# Patient Record
Sex: Female | Born: 1970
Health system: Southern US, Community
[De-identification: ages and names within clinical notes are randomized; demographics above are authoritative.]

## PROBLEM LIST (undated history)

## (undated) DIAGNOSIS — I471 Supraventricular tachycardia, unspecified: Secondary | ICD-10-CM

## (undated) DIAGNOSIS — I10 Essential (primary) hypertension: Secondary | ICD-10-CM

## (undated) DIAGNOSIS — K529 Noninfective gastroenteritis and colitis, unspecified: Secondary | ICD-10-CM

## (undated) DIAGNOSIS — N809 Endometriosis, unspecified: Secondary | ICD-10-CM

## (undated) DIAGNOSIS — K589 Irritable bowel syndrome without diarrhea: Secondary | ICD-10-CM

## (undated) HISTORY — PX: LAPAROSCOPY: SHX197

## (undated) HISTORY — PX: COLONOSCOPY: SHX174

---

## 2001-02-19 ENCOUNTER — Emergency Department (HOSPITAL_COMMUNITY): Admission: EM | Admit: 2001-02-19 | Discharge: 2001-02-19 | Payer: Self-pay

## 2001-03-17 ENCOUNTER — Emergency Department (HOSPITAL_COMMUNITY): Admission: EM | Admit: 2001-03-17 | Discharge: 2001-03-17 | Payer: Self-pay | Admitting: Emergency Medicine

## 2002-01-02 ENCOUNTER — Emergency Department (HOSPITAL_COMMUNITY): Admission: EM | Admit: 2002-01-02 | Discharge: 2002-01-03 | Payer: Self-pay | Admitting: Emergency Medicine

## 2003-05-01 ENCOUNTER — Emergency Department (HOSPITAL_COMMUNITY): Admission: EM | Admit: 2003-05-01 | Discharge: 2003-05-01 | Payer: Self-pay | Admitting: Emergency Medicine

## 2003-07-22 ENCOUNTER — Emergency Department (HOSPITAL_COMMUNITY): Admission: EM | Admit: 2003-07-22 | Discharge: 2003-07-23 | Payer: Self-pay | Admitting: Emergency Medicine

## 2005-03-23 ENCOUNTER — Emergency Department (HOSPITAL_COMMUNITY): Admission: EM | Admit: 2005-03-23 | Discharge: 2005-03-23 | Payer: Self-pay | Admitting: Emergency Medicine

## 2006-11-02 ENCOUNTER — Inpatient Hospital Stay (HOSPITAL_COMMUNITY): Admission: EM | Admit: 2006-11-02 | Discharge: 2006-11-06 | Payer: Self-pay | Admitting: Emergency Medicine

## 2007-01-18 ENCOUNTER — Other Ambulatory Visit: Payer: Self-pay

## 2007-01-18 ENCOUNTER — Other Ambulatory Visit: Payer: Self-pay | Admitting: Emergency Medicine

## 2007-01-18 ENCOUNTER — Observation Stay (HOSPITAL_COMMUNITY): Admission: AD | Admit: 2007-01-18 | Discharge: 2007-01-19 | Payer: Self-pay | Admitting: Cardiovascular Disease

## 2007-01-26 ENCOUNTER — Encounter: Admission: RE | Admit: 2007-01-26 | Discharge: 2007-01-26 | Payer: Self-pay | Admitting: Cardiovascular Disease

## 2007-12-23 ENCOUNTER — Inpatient Hospital Stay (HOSPITAL_COMMUNITY): Admission: AD | Admit: 2007-12-23 | Discharge: 2007-12-23 | Payer: Self-pay | Admitting: Obstetrics & Gynecology

## 2007-12-26 ENCOUNTER — Emergency Department (HOSPITAL_COMMUNITY): Admission: EM | Admit: 2007-12-26 | Discharge: 2007-12-27 | Payer: Self-pay | Admitting: Emergency Medicine

## 2009-01-23 ENCOUNTER — Emergency Department (HOSPITAL_COMMUNITY): Admission: EM | Admit: 2009-01-23 | Discharge: 2009-01-23 | Payer: Self-pay | Admitting: Emergency Medicine

## 2010-03-27 ENCOUNTER — Ambulatory Visit: Payer: Self-pay | Admitting: Internal Medicine

## 2010-04-13 ENCOUNTER — Emergency Department (HOSPITAL_COMMUNITY): Admission: EM | Admit: 2010-04-13 | Discharge: 2010-04-13 | Payer: Self-pay | Admitting: Emergency Medicine

## 2010-08-21 LAB — WET PREP, GENITAL
Clue Cells Wet Prep HPF POC: NONE SEEN
Trich, Wet Prep: NONE SEEN
WBC, Wet Prep HPF POC: NONE SEEN
Yeast Wet Prep HPF POC: NONE SEEN

## 2010-08-21 LAB — GC/CHLAMYDIA PROBE AMP, GENITAL
Chlamydia, DNA Probe: NEGATIVE
GC Probe Amp, Genital: NEGATIVE

## 2010-09-10 ENCOUNTER — Emergency Department (HOSPITAL_COMMUNITY)
Admission: EM | Admit: 2010-09-10 | Discharge: 2010-09-11 | Discharge status: Against Medical Advice | Payer: Medicaid Other | Attending: Emergency Medicine | Admitting: Emergency Medicine

## 2010-09-10 DIAGNOSIS — W57XXXA Bitten or stung by nonvenomous insect and other nonvenomous arthropods, initial encounter: Secondary | ICD-10-CM | POA: Insufficient documentation

## 2010-09-10 DIAGNOSIS — S30860A Insect bite (nonvenomous) of lower back and pelvis, initial encounter: Secondary | ICD-10-CM | POA: Insufficient documentation

## 2010-09-17 ENCOUNTER — Ambulatory Visit (INDEPENDENT_AMBULATORY_CARE_PROVIDER_SITE_OTHER): Payer: Self-pay | Admitting: Internal Medicine

## 2010-09-18 ENCOUNTER — Ambulatory Visit (INDEPENDENT_AMBULATORY_CARE_PROVIDER_SITE_OTHER): Payer: Medicaid Other | Admitting: Internal Medicine

## 2010-09-18 DIAGNOSIS — R634 Abnormal weight loss: Secondary | ICD-10-CM

## 2010-09-18 DIAGNOSIS — K589 Irritable bowel syndrome without diarrhea: Secondary | ICD-10-CM

## 2010-10-23 NOTE — Discharge Summary (Signed)
Becky Vargas, Becky Vargas              ACCOUNT NO.:  [id number]   MEDICAL RECORD NO.:  [id number]          PATIENT TYPE:  INP   LOCATION:  2012                         FACILITY:  [facility]   PHYSICIAN:  Ricki Rodriguez, M.D.  DATE OF BIRTH:  11-23-1970   DATE OF ADMISSION:  11/02/2006  DATE OF DISCHARGE:  11/06/2006                               DISCHARGE SUMMARY   FINAL DIAGNOSES:  1. Supraventricular tachy arrhythmia.  2. Noncardiac chest pain.  3. Paroxysmal atrial flutter.  4. Nonsustained ventricular tachycardia.  5. Anxiety.  6. Tobacco use disorder.  7. Marijuana abuse.   DISCHARGE MEDICATIONS:  1. Aspirin 325 mg 1 daily.  2. Lipitor 80 mg 1 daily.  3. Lopressor 50 mg 1 twice daily.  4. Norethindrone 5 mg daily.  5. Diltiazem 240 mg CD/ER 1 daily.   WOUND CARE:  Patient will notify right groin pain, swelling or  discharge.   DISCHARGE DIET:  Heart healthy diet.   DISCHARGE ACTIVITIES:  Patient increase activity slowly.   FOLLOWUP:  By Dr. Orpah Cobb in 2 weeks.  Patient to call [phone] for  appointment and by Dr. Reuel Boom in 2-4 weeks.  Patient to call [phone]  for appointment.   SPECIAL INSTRUCTIONS:  The patient to continue smoking, alcohol, drugs  and caffeine use.   HISTORY:  This is a 40 year old black female presented with  supraventricular tachycardia and chest pain.  Patient had a history of  drug abuse.   PHYSICAL EXAMINATION:  VITAL SIGNS:  Heart rate 109 after medication,  prior to that it was 180.  Blood pressure 101/67.  Respirations are 18.  GENERAL:  Patient is well-nourished, short woman in no acute distress.  HEENT:  Unremarkable.  NECK:  No JVD.  No carotid bruits.  LUNGS:  Clear bilaterally.  HEART:  Normal S1, S2 without S3 or gallop.  ABDOMEN:  Soft and nontender.  EXTREMITIES:  No edema.  NEUROLOGICAL:  Patient had normal cranial nerves and moves all 4  extremities.   LABORATORY DATA:  Normal electrolytes, BUN and creatinine.  Normal  WBC  count, hemoglobin, hematocrit and platelet count.  Cardiac enzymes:  CK-  MB elevated at 22 and troponin of 1.67.  Lipid profile near normal with  a low HDL of 33.   Cardiac catheterization showed normal coronaries.   HOSPITAL COURSE:  Patient was admitted to telemetry unit because of  abnormal cardiac enzymes and chest pain.  He underwent cardiac  catheterization, but failed to show any significant coronary artery  disease.  As it appears, the patient may have coronary spasm secondary  to drug abuse.  She was strongly advised to discontinue any cardiac  stimulant medication or food and she was treated well with IV Cardizem,  followed by p.o. Cardizem, beta-blockers were added and aspirin and  Lipitor were added also.  She may have addition of niacin in the near  future if her low HDL level does not improve with exercise.  She was  strongly advised to change her current lifestyle and she will be  followed by me in 2 weeks and by primary  care physician, Dr. Reuel Boom, in  2-4 weeks.      Ricki Rodriguez, M.D.  Electronically Signed     ASK/MEDQ  D:  11/06/2006  T:  11/06/2006  Job:  [id number]   cc:   Donzetta Sprung

## 2010-10-23 NOTE — Cardiovascular Report (Signed)
Becky Vargas, Becky Vargas              ACCOUNT NO.:  [id number]   MEDICAL RECORD NO.:  [id number]          PATIENT TYPE:  INP   LOCATION:  2012                         FACILITY:  [facility]   PHYSICIAN:  Ricki Rodriguez, M.D.  DATE OF BIRTH:  Aug 01, 1970   DATE OF PROCEDURE:  11/04/2006  DATE OF DISCHARGE:                            CARDIAC CATHETERIZATION   PROCEDURE PERFORMED:  Left heart catheterization and selective coronary  angiography.   APPROACH:  Right femoral artery using 5-French sheath and catheters.   COMPLICATIONS:  None.   INDICATIONS:  This 40 year old black female with recurrent chest pain  had abnormal cardiac enzymes and EKG changes of ischemia along with  history of drug abuse and smoking.   HEMODYNAMIC DATA:  The left ventricular pressure was 131/14 and aortic  pressure was 133/72.   CORONARY ANATOMY:  LEFT MAIN CORONARY ARTERY:  The left main coronary  artery was unremarkable.   LEFT ANTERIOR DESCENDING CORONARY ARTERY:  The left anterior descending  coronary artery was also unremarkable and it wrapped around the apex of  the heart supplying a posterior 1/3 to 1/2 of the septum.  The diagonal  vessel one and two were unremarkable.   LEFT CIRCUMFLEX CORONARY ARTERY:  The left circumflex coronary artery  was also unremarkable.  It was dominant.  Its obtuse marginal branch was  normal and posterolateral and posterior descending coronary arteries  were also unremarkable.   RIGHT CORONARY ARTERY:  The right artery was very small and  unremarkable.   IMPRESSION:  1. Normal coronaries.  2. Noncardiac chest pain.   RECOMMENDATIONS:  This patient will be treated medically with lifestyle  modification.      Ricki Rodriguez, M.D.  Electronically Signed     ASK/MEDQ  D:  11/04/2006  T:  11/04/2006  Job:  [id number]

## 2010-10-26 NOTE — Discharge Summary (Signed)
NAMEALVIA, Becky Vargas              ACCOUNT NO.:  [id number]   MEDICAL RECORD NO.:  [id number]          PATIENT TYPE:  INP   LOCATION:  2012                         FACILITY:  [facility]   PHYSICIAN:  Osvaldo Shipper. Spruill, M.D.DATE OF BIRTH:  Feb 16, 1971   DATE OF ADMISSION:  11/02/2006  DATE OF DISCHARGE:  11/06/2006                               DISCHARGE SUMMARY   DISCHARGE DIAGNOSIS:  Chest pain.   PROCEDURE:  Cardiac catheterization on Nov 04, 2006 by Dr. Orpah Cobb.   Becky Vargas is a 40 year old patient who presented to the [facility]  emergency department with complaint of chest pain.  The patient gave a  history that the pain started on the day prior to this admission.  It  did not stop yesterday and kept returning throughout the night.  It was  associated with shortness of breath and nausea but no vomiting.  There  was also noted of fast heart rate.  The patient was evaluated in the  emergency department.  The heart rate noted in the emergency department  was 189 with a blood pressure of 82 by Doppler.   The patient was given adenosine, and this was followed by IV Cardizem  and then an IV Cardizem drip.  The patient's heart rate began to  improve.  It was the opinion that the patient would need an admission,  and an admission history and physical was completed by Dr. Donia Guiles.  On further questioning, the patient denied any cocaine use or  IV drug use of any kind.  He denied any use of over-the-counter herbs or  other medications.  There was no childhood history of heart disease, and  there was no history of thyroid disease.  No history of hypertension or  diabetes.   The patient was subsequently admitted, was continued on a Cardizem drip,  was placed on telemetry.  Thyroid workup was initiated, as well as a  workup to rule out a pulmonary embolus, as there was documented atrial  fibrillation during the patient's evaluation in the emergency  department.  Plans were made  for cardiac catheterization.   The cardiac catheterization was done on Nov 04, 2006.  It revealed the  left main to be okay, the LAD to be okay, the circumflex to be okay with  a dominant OM.  The right coronary artery was small, but overall there  were normal coronary arteries.  Following this, a transthoracic  echocardiogram was performed which revealed the overall left ventricular  systolic function to be normal.  The left ventricular ejection fraction  was estimated to be 60%.  There were no left ventricular regional wall  motion abnormalities noted.   On Nov 05, 2006, the patient was feeling better.  The vital signs were  stable with the exception of a sinus bradycardia, and on Nov 06, 2006,  it was the opinion that the patient had received maximum benefit from  this hospitalization and was in stable condition and could be discharged  home.   MEDICATIONS AT DISCHARGE:  1. Aspirin daily.  2. Niacin 250 mg at bedtime for 2  weeks and then 500 mg at bedtime.  3. Lopressor 50 mg twice a day.  4. Diltiazem 240 mg CD ER one daily.  5. Norethindrone 5 mg daily.   FOLLOWUP:  The patient is to see Dr. Algie Coffer in 2 weeks for follow up.  The patient is to notify the physician immediately if any changes,  problems or concerns.      Ivery Quale, P.A.      Osvaldo Shipper. Spruill, M.D.  Electronically Signed    HB/MEDQ  D:  12/09/2006  T:  12/09/2006  Job:  [id number]

## 2010-10-26 NOTE — Discharge Summary (Signed)
Vargas, Becky              ACCOUNT NO.:  [id number]   MEDICAL RECORD NO.:  [id number]          PATIENT TYPE:  OBV   LOCATION:  3729                         FACILITY:  [facility]   PHYSICIAN:  Ricki Rodriguez, M.D.  DATE OF BIRTH:  [birth year]   DATE OF ADMISSION:  01/18/2007  DATE OF DISCHARGE:  01/19/2007                               DISCHARGE SUMMARY   FINAL DIAGNOSES:  1. Supraventricular tachycardia  2. Anxiety  3. Tobacco use disorder   DISCHARGE ACTIVITIES:  The patient to increase activity slowly.   DISCHARGE DIET:  Low-sodium heart-healthy no caffeine diet.   CONDITION ON DISCHARGE:  Improved.   FOLLOW-UP:  By Dr. Orpah Cobb in 1 week.  The patient to call [phone]  for appointment   DISCHARGE MEDICATIONS:  1. Metoprolol 50 mg half twice daily  2. Diltiazem extended release 300 mg daily  3. Aspirin 325 mg one at bedtime  4. Niacin 500 mg one at bedtime.   HISTORY:  This 40 year old black female with a known history of  paroxysmal supraventricular  tachycardia generally controlled with  Cardizem had palpitations on visiting church.  No chest pain, dizziness,  shortness of breath.  Thyroid-stimulating hormone was normal 3 months  ago.  The patient has cardiac risk factors of smoking and she has  occasional alcohol intake and occasional recreational drug use.   PHYSICAL EXAMINATION:  Pulse 65, respiration 16, blood pressure 102/78,  temperature 98.8.  She is approximately 5 feet 1 inch and weighs  approximately 140 pounds.  HEENT: The patient is normocephalic, atraumatic with round eyes.  Conjunctivae pink.  Sclerae are white.  Extraocular movement intact.  NECK:  No JVD, no carotid bruit.  LUNGS:  Clear bilaterally.  HEART:  Normal S1-S2.  ABDOMEN:  Soft and nontender.  EXTREMITIES:  No edema, cyanosis, clubbing.  CNS: Cranial nerves grossly intact.  Marland Kitchen   LABORATORY DATA:  Normal hemoglobin/hematocrit, WBC count, platelet  count, normal electrolytes, BUN,  creatinine.  EKG showed sinus  bradycardia with possible septal infarct.   HOSPITAL COURSE:  The patient was admitted to telemetry unit. There were  no episodes of supraventricular tachyarrhythmias.  A small dose of beta  blocker was added to further control the patient's symptoms.  He was  given a choice of considering ablation with  EP consult if medications  do not work. Apparently the patient wants to continue medications for  now.      Ricki Rodriguez, M.D.  Electronically Signed     ASK/MEDQ  D:  04/16/2007  T:  04/16/2007  Job:  [id number]

## 2010-11-06 ENCOUNTER — Ambulatory Visit (INDEPENDENT_AMBULATORY_CARE_PROVIDER_SITE_OTHER): Payer: Medicaid Other | Admitting: Internal Medicine

## 2010-11-23 ENCOUNTER — Emergency Department (HOSPITAL_COMMUNITY)
Admission: EM | Admit: 2010-11-23 | Discharge: 2010-11-23 | Disposition: A | Discharge status: Home / Self Care | Payer: Medicaid Other | Attending: Emergency Medicine | Admitting: Emergency Medicine

## 2010-11-23 DIAGNOSIS — M79609 Pain in unspecified limb: Secondary | ICD-10-CM | POA: Insufficient documentation

## 2010-11-23 DIAGNOSIS — K589 Irritable bowel syndrome without diarrhea: Secondary | ICD-10-CM | POA: Insufficient documentation

## 2010-11-23 DIAGNOSIS — M25519 Pain in unspecified shoulder: Secondary | ICD-10-CM | POA: Insufficient documentation

## 2010-11-23 DIAGNOSIS — I10 Essential (primary) hypertension: Secondary | ICD-10-CM | POA: Insufficient documentation

## 2010-11-23 DIAGNOSIS — M542 Cervicalgia: Secondary | ICD-10-CM | POA: Insufficient documentation

## 2010-11-23 DIAGNOSIS — I498 Other specified cardiac arrhythmias: Secondary | ICD-10-CM | POA: Insufficient documentation

## 2010-11-23 DIAGNOSIS — F17200 Nicotine dependence, unspecified, uncomplicated: Secondary | ICD-10-CM | POA: Insufficient documentation

## 2010-12-28 ENCOUNTER — Emergency Department (HOSPITAL_COMMUNITY)
Admission: EM | Admit: 2010-12-28 | Discharge: 2010-12-28 | Disposition: A | Discharge status: Other Acute Inpatient Hospital | Payer: Medicaid Other | Attending: Emergency Medicine | Admitting: Emergency Medicine

## 2010-12-28 DIAGNOSIS — F17200 Nicotine dependence, unspecified, uncomplicated: Secondary | ICD-10-CM | POA: Insufficient documentation

## 2010-12-28 DIAGNOSIS — K648 Other hemorrhoids: Secondary | ICD-10-CM

## 2010-12-28 DIAGNOSIS — B9689 Other specified bacterial agents as the cause of diseases classified elsewhere: Secondary | ICD-10-CM | POA: Insufficient documentation

## 2010-12-28 DIAGNOSIS — N760 Acute vaginitis: Secondary | ICD-10-CM | POA: Insufficient documentation

## 2010-12-28 DIAGNOSIS — I10 Essential (primary) hypertension: Secondary | ICD-10-CM | POA: Insufficient documentation

## 2010-12-28 DIAGNOSIS — A499 Bacterial infection, unspecified: Secondary | ICD-10-CM | POA: Insufficient documentation

## 2010-12-28 HISTORY — DX: Supraventricular tachycardia: I47.1

## 2010-12-28 HISTORY — DX: Supraventricular tachycardia, unspecified: I47.10

## 2010-12-28 HISTORY — DX: Essential (primary) hypertension: I10

## 2010-12-28 LAB — URINALYSIS, ROUTINE W REFLEX MICROSCOPIC
Glucose, UA: NEGATIVE mg/dL
Ketones, ur: 15 mg/dL — AB
Leukocytes, UA: NEGATIVE
Nitrite: NEGATIVE
Specific Gravity, Urine: 1.025 (ref 1.005–1.030)
Urobilinogen, UA: 1 mg/dL (ref 0.0–1.0)
pH: 6.5 (ref 5.0–8.0)

## 2010-12-28 LAB — WET PREP, GENITAL
Trich, Wet Prep: NONE SEEN
Yeast Wet Prep HPF POC: NONE SEEN

## 2010-12-28 LAB — URINE MICROSCOPIC-ADD ON

## 2010-12-28 LAB — PREGNANCY, URINE: Preg Test, Ur: NEGATIVE

## 2010-12-28 MED ORDER — HYDROCORTISONE 2.5 % RE CREA: TOPICAL_CREAM | RECTAL | Status: AC

## 2010-12-28 MED ORDER — METRONIDAZOLE 500 MG PO TABS: 500.0000 mg | ORAL_TABLET | Freq: Two times a day (BID) | ORAL | Status: AC

## 2010-12-28 NOTE — ED Provider Notes (Signed)
History     Chief Complaint  Patient presents with  . Anal Itching  . Vaginal Pain   Patient is a 40 y.o. female presenting with vaginal pain. The history is provided by the patient.  Vaginal Pain This is a new (She describes pain with urination and constant pain and burning irritation of her vagina.  She was treated for  trichomonas 4 months ago by her gyn in [place],  symptoms were better until this past week.) problem. The current episode started in the past 7 days. The problem occurs constantly. Pertinent negatives include no abdominal pain, anorexia, arthralgias, chest pain, congestion, fever, headaches, joint swelling, nausea, neck pain, numbness, rash, sore throat, swollen glands or weakness. Associated symptoms comments: She has had clear to white vaginal dc.  Last menses more than one year ago,  Off depo since 1/12.  Also reports anal irritation and itching,  Had dark but well formed stool this am.   States she had a "piece of tissue" sticking out after a bm today,  But she pushed it back inside and pain improved.   Sexually active with 2 partners since tx for trich 3/12.  Reports always uses condom.. Exacerbated by: urination. She has tried nothing for the symptoms.    Past Medical History  Diagnosis Date  . SVT (supraventricular tachycardia)   . Hypertension     History reviewed. No pertinent past surgical history.  History reviewed. No pertinent family history.  History  Substance Use Topics  . Smoking status: Current Everyday Smoker -- 1.0 packs/day  . Smokeless tobacco: Not on file  . Alcohol Use: Yes    OB History    Grav Para Term Preterm Abortions TAB SAB Ect Mult Living                  Review of Systems  Constitutional: Negative for fever.  HENT: Negative for congestion, sore throat and neck pain.   Eyes: Negative.   Respiratory: Negative for chest tightness and shortness of breath.   Cardiovascular: Negative for chest pain.  Gastrointestinal: Negative for  nausea, abdominal pain and anorexia.  Genitourinary: Positive for dysuria, vaginal discharge and vaginal pain. Negative for urgency, decreased urine volume and vaginal bleeding.  Musculoskeletal: Negative for joint swelling and arthralgias.  Skin: Negative.  Negative for rash and wound.  Neurological: Negative for dizziness, weakness, light-headedness, numbness and headaches.  Hematological: Negative.   Psychiatric/Behavioral: Negative.     Physical Exam  BP 124/82  Pulse 93  Temp(Src) 99 F (37.2 C) (Oral)  Resp 20  Ht 4\' 11"  (1.499 m)  Wt 110 lb (49.896 kg)  BMI 22.22 kg/m2  SpO2 99%  Physical Exam  Vitals reviewed. Constitutional: She is oriented to person, place, and time. She appears well-developed and well-nourished.  HENT:  Head: Normocephalic and atraumatic.  Eyes: Conjunctivae are normal.  Neck: Normal range of motion.  Cardiovascular: Normal rate, regular rhythm, normal heart sounds and intact distal pulses.   Pulmonary/Chest: Effort normal and breath sounds normal. She has no wheezes.  Abdominal: Soft. Bowel sounds are normal. There is no tenderness.  Genitourinary: Rectum normal, vagina normal and uterus normal. Rectal exam shows no tenderness. Guaiac negative stool. Pelvic exam was performed with patient supine. Cervix exhibits no motion tenderness and no friability. Right adnexum displays no mass and no tenderness. Left adnexum displays no mass and no tenderness. No erythema or tenderness around the vagina. No vaginal discharge found.       No appreciable internal  hemorrhoid palpated.  Musculoskeletal: Normal range of motion.  Neurological: She is alert and oriented to person, place, and time.  Skin: Skin is warm and dry.  Psychiatric: She has a normal mood and affect.    ED Course  Procedures  MDM   Suspect possible internal hemorrhoid,  Not seen or felt on exam,  But suspicious from pt history.      Candis Musa, PA 12/28/10 2140

## 2010-12-28 NOTE — ED Notes (Signed)
Pt presents with vaginal pain and burning during urination. Pt also c/o anal itching. Pt states she has IBS and her stool was black this AM.

## 2010-12-29 NOTE — ED Provider Notes (Signed)
Medical screening examination/treatment/procedure(s) were performed by non-physician practitioner and as supervising physician I was immediately available for consultation/collaboration. Devoria Albe, MD, FACEP   Ward Givens, MD 12/29/10 [phone]

## 2010-12-31 LAB — GC/CHLAMYDIA PROBE AMP, GENITAL
Chlamydia, DNA Probe: NEGATIVE
GC Probe Amp, Genital: NEGATIVE

## 2011-03-08 LAB — WET PREP, GENITAL
Trich, Wet Prep: NONE SEEN
Trich, Wet Prep: NONE SEEN
WBC, Wet Prep HPF POC: NONE SEEN
Yeast Wet Prep HPF POC: NONE SEEN
Yeast Wet Prep HPF POC: NONE SEEN

## 2011-03-08 LAB — URINALYSIS, ROUTINE W REFLEX MICROSCOPIC
Bilirubin Urine: NEGATIVE
Glucose, UA: NEGATIVE
Ketones, ur: 15 — AB
Nitrite: NEGATIVE
Protein, ur: NEGATIVE
Specific Gravity, Urine: 1.031 — ABNORMAL HIGH
Urobilinogen, UA: 0.2
pH: 6

## 2011-03-08 LAB — GC/CHLAMYDIA PROBE AMP, GENITAL
Chlamydia, DNA Probe: NEGATIVE
Chlamydia, DNA Probe: NEGATIVE
GC Probe Amp, Genital: NEGATIVE
GC Probe Amp, Genital: NEGATIVE

## 2011-03-08 LAB — URINE MICROSCOPIC-ADD ON

## 2011-03-08 LAB — POCT PREGNANCY, URINE
Operator id: 222501
Preg Test, Ur: NEGATIVE

## 2011-03-25 LAB — POCT CARDIAC MARKERS
CKMB, poc: 1
Myoglobin, poc: 45.6
Operator id: 221061
Troponin i, poc: <0.05

## 2011-03-25 LAB — DIFFERENTIAL
Basophils Absolute: 0
Basophils Relative: 1
Eosinophils Relative: 0
Monocytes Absolute: 0.3
Monocytes Relative: 6
Neutro Abs: 2.9

## 2011-03-25 LAB — CBC
HCT: 37.9
HCT: 38
Hemoglobin: 13.1
Hemoglobin: 13.1
MCHC: 34.6
MCHC: 34.4
MCV: 98.2
MCV: 96.1
Platelets: 244
Platelets: 232
RBC: 3.86 — ABNORMAL LOW
RBC: 3.95
RDW: 13.3
RDW: 13.4
WBC: 5.6
WBC: 5.1

## 2011-03-25 LAB — RAPID URINE DRUG SCREEN, HOSP PERFORMED
Amphetamines: NOT DETECTED
Cocaine: NOT DETECTED
Opiates: NOT DETECTED
Tetrahydrocannabinol: POSITIVE — AB

## 2011-03-25 LAB — BASIC METABOLIC PANEL
BUN: 6
BUN: 6
CO2: 26
CO2: 26
Calcium: 8.9
Calcium: 9
Chloride: 105
Chloride: 107
Creatinine, Ser: 0.74
Creatinine, Ser: 0.69
GFR calc Af Amer: >60
GFR calc Af Amer: >60
GFR calc non Af Amer: >60
GFR calc non Af Amer: >60
Glucose, Bld: 97
Glucose, Bld: 98
Potassium: 4.2
Potassium: 3.3 — ABNORMAL LOW
Sodium: 137
Sodium: 136

## 2011-06-19 ENCOUNTER — Encounter (HOSPITAL_COMMUNITY): Payer: Self-pay

## 2011-06-19 ENCOUNTER — Emergency Department (HOSPITAL_COMMUNITY)
Admission: EM | Admit: 2011-06-19 | Discharge: 2011-06-19 | Disposition: A | Discharge status: Home / Self Care | Payer: Medicaid Other | Attending: Emergency Medicine | Admitting: Emergency Medicine

## 2011-06-19 DIAGNOSIS — F17200 Nicotine dependence, unspecified, uncomplicated: Secondary | ICD-10-CM | POA: Insufficient documentation

## 2011-06-19 DIAGNOSIS — I10 Essential (primary) hypertension: Secondary | ICD-10-CM | POA: Insufficient documentation

## 2011-06-19 DIAGNOSIS — N9489 Other specified conditions associated with female genital organs and menstrual cycle: Secondary | ICD-10-CM | POA: Insufficient documentation

## 2011-06-19 DIAGNOSIS — B9689 Other specified bacterial agents as the cause of diseases classified elsewhere: Secondary | ICD-10-CM | POA: Insufficient documentation

## 2011-06-19 DIAGNOSIS — A499 Bacterial infection, unspecified: Secondary | ICD-10-CM | POA: Insufficient documentation

## 2011-06-19 DIAGNOSIS — N760 Acute vaginitis: Secondary | ICD-10-CM | POA: Insufficient documentation

## 2011-06-19 LAB — PREGNANCY, URINE: Preg Test, Ur: NEGATIVE

## 2011-06-19 LAB — WET PREP, GENITAL: Yeast Wet Prep HPF POC: NONE SEEN

## 2011-06-19 LAB — URINALYSIS, ROUTINE W REFLEX MICROSCOPIC
Bilirubin Urine: NEGATIVE
Nitrite: NEGATIVE
Specific Gravity, Urine: 1.015 (ref 1.005–1.030)
pH: 7 (ref 5.0–8.0)

## 2011-06-19 LAB — URINE CULTURE: Culture  Setup Time: 201301101358

## 2011-06-19 LAB — URINE MICROSCOPIC-ADD ON

## 2011-06-19 MED ORDER — METRONIDAZOLE 500 MG PO TABS: 500.0000 mg | ORAL_TABLET | Freq: Two times a day (BID) | ORAL | Status: AC

## 2011-06-19 NOTE — ED Provider Notes (Signed)
History     CSN: [id number]  Arrival date & time 06/19/11  1711   First MD Initiated Contact with Patient 06/19/11 1824      No chief complaint on file.   (Consider location/radiation/quality/duration/timing/severity/associated sxs/prior treatment) HPI Comments: Patient c/o vaginal irritation and redness for 1-2 weeks.  Also c/o occasional vaginal discharge.  She denies urinary sx's, vag bleeding, or abdominal pain  Patient is a 41 y.o. female presenting with vaginal discharge. The history is provided by the patient.  Vaginal Discharge This is a new problem. The current episode started 1 to 4 weeks ago. The problem occurs constantly. The problem has been unchanged. Pertinent negatives include no abdominal pain, arthralgias, chest pain, chills, fever, headaches, myalgias, nausea, numbness, rash, swollen glands, urinary symptoms, vomiting or weakness. The symptoms are aggravated by nothing. She has tried nothing for the symptoms. The treatment provided mild relief.    Past Medical History  Diagnosis Date  . SVT (supraventricular tachycardia)   . Hypertension     History reviewed. No pertinent past surgical history.  No family history on file.  History  Substance Use Topics  . Smoking status: Current Everyday Smoker -- 1.0 packs/day  . Smokeless tobacco: Not on file  . Alcohol Use: Yes    OB History    Grav Para Term Preterm Abortions TAB SAB Ect Mult Living                  Review of Systems  Constitutional: Negative for fever and chills.  Respiratory: Negative for shortness of breath.   Cardiovascular: Negative for chest pain.  Gastrointestinal: Negative for nausea, vomiting, abdominal pain and constipation.  Genitourinary: Positive for vaginal discharge and vaginal pain. Negative for dysuria, urgency, hematuria, flank pain, vaginal bleeding, difficulty urinating, menstrual problem and pelvic pain.  Musculoskeletal: Negative for myalgias, back pain and arthralgias.    Skin: Negative.  Negative for rash.  Neurological: Negative for dizziness, weakness, numbness and headaches.  Hematological: Does not bruise/bleed easily.  All other systems reviewed and are negative.    Allergies  Sulfa antibiotics  Home Medications   Current Outpatient Rx  Name Route Sig Dispense Refill  . METOPROLOL SUCCINATE ER 50 MG PO TB24 Oral Take 50 mg by mouth daily.        BP 177/94  Pulse 72  Temp(Src) 98.4 F (36.9 C) (Oral)  Resp 16  Ht 4\' 11"  (1.499 m)  Wt 108 lb (48.988 kg)  BMI 21.81 kg/m2  SpO2 100%  LMP 06/16/2011  Physical Exam  Nursing note and vitals reviewed. Constitutional: She is oriented to person, place, and time. She appears well-developed and well-nourished. No distress.  HENT:  Head: Normocephalic and atraumatic.  Mouth/Throat: Oropharynx is clear and moist.  Neck: Normal range of motion. Neck supple.  Cardiovascular: Normal rate, regular rhythm and normal heart sounds.   Pulmonary/Chest: Effort normal and breath sounds normal. No respiratory distress. She exhibits no tenderness.  Abdominal: Soft. She exhibits no distension. There is no tenderness.  Genitourinary: Uterus normal. There is no tenderness on the right labia. There is no tenderness on the left labia. Cervix exhibits no motion tenderness, no discharge and no friability. Right adnexum displays no mass and no tenderness. Left adnexum displays no mass and no tenderness. No erythema, tenderness or bleeding around the vagina. No foreign body around the vagina. Vaginal discharge found.  Musculoskeletal: Normal range of motion. She exhibits no tenderness.  Lymphadenopathy:    She has no cervical adenopathy.  Neurological: She is alert and oriented to person, place, and time. No cranial nerve deficit. She exhibits normal muscle tone. Coordination normal.  Skin: Skin is warm and dry.    ED Course  Procedures (including critical care time)  Results for orders placed during the hospital  encounter of 06/19/11  WET PREP, GENITAL      Component Value Range   Yeast, Wet Prep NONE SEEN  NONE SEEN    Trich, Wet Prep NONE SEEN  NONE SEEN    Clue Cells, Wet Prep FEW (*) NONE SEEN    WBC, Wet Prep HPF POC FEW (*) NONE SEEN   PREGNANCY, URINE      Component Value Range   Preg Test, Ur NEGATIVE    URINALYSIS, ROUTINE W REFLEX MICROSCOPIC      Component Value Range   Color, Urine YELLOW  YELLOW    APPearance CLEAR  CLEAR    Specific Gravity, Urine 1.015  1.005 - 1.030    pH 7.0  5.0 - 8.0    Glucose, UA NEGATIVE  NEGATIVE (mg/dL)   Hgb urine dipstick MODERATE (*) NEGATIVE    Bilirubin Urine NEGATIVE  NEGATIVE    Ketones, ur TRACE (*) NEGATIVE (mg/dL)   Protein, ur NEGATIVE  NEGATIVE (mg/dL)   Urobilinogen, UA 0.2  0.0 - 1.0 (mg/dL)   Nitrite NEGATIVE  NEGATIVE    Leukocytes, UA NEGATIVE  NEGATIVE   URINE MICROSCOPIC-ADD ON      Component Value Range   Squamous Epithelial / LPF RARE  RARE    WBC, UA 0-2  <3 (WBC/hpf)   RBC / HPF 3-6  <3 (RBC/hpf)   Bacteria, UA RARE  RARE      gc and chlamydia culture pending    MDM   Patient is alert, vitals stable, non-toxic appearing.  Stable for d/c.  Agrees to f/u with GYN  Patient / Family / Caregiver understand and agree with initial ED impression and plan with expectations set for ED visit.   Pt stable in ED with no significant deterioration in condition.          Damyn Weitzel L. Suyash Amory, [place] 06/22/11 2019

## 2011-06-19 NOTE — ED Notes (Signed)
Pt reports redness to vaginal area x 1 1/2 weeks.  Denies other symptoms.

## 2011-06-19 NOTE — ED Notes (Signed)
Pt says "burning " of labia for 7-10 days, No NVD, denies urinary sx, Has nasal congestion.

## 2011-06-24 NOTE — ED Provider Notes (Signed)
Medical screening examination/treatment/procedure(s) were performed by non-physician practitioner and as supervising physician I was immediately available for consultation/collaboration.   Daniyah Fohl L Ostin Mathey, MD 06/24/11 1429 

## 2012-02-23 ENCOUNTER — Encounter (HOSPITAL_COMMUNITY): Payer: Self-pay | Admitting: Physical Medicine and Rehabilitation

## 2012-02-23 ENCOUNTER — Emergency Department (HOSPITAL_COMMUNITY)
Admission: EM | Admit: 2012-02-23 | Discharge: 2012-02-23 | Disposition: A | Discharge status: Home / Self Care | Payer: Self-pay | Attending: Emergency Medicine | Admitting: Emergency Medicine

## 2012-02-23 DIAGNOSIS — N760 Acute vaginitis: Secondary | ICD-10-CM | POA: Insufficient documentation

## 2012-02-23 DIAGNOSIS — F17200 Nicotine dependence, unspecified, uncomplicated: Secondary | ICD-10-CM | POA: Insufficient documentation

## 2012-02-23 DIAGNOSIS — I10 Essential (primary) hypertension: Secondary | ICD-10-CM | POA: Insufficient documentation

## 2012-02-23 DIAGNOSIS — A499 Bacterial infection, unspecified: Secondary | ICD-10-CM | POA: Insufficient documentation

## 2012-02-23 DIAGNOSIS — Z882 Allergy status to sulfonamides status: Secondary | ICD-10-CM | POA: Insufficient documentation

## 2012-02-23 DIAGNOSIS — B9689 Other specified bacterial agents as the cause of diseases classified elsewhere: Secondary | ICD-10-CM | POA: Insufficient documentation

## 2012-02-23 HISTORY — DX: Irritable bowel syndrome, unspecified: K58.9

## 2012-02-23 HISTORY — DX: Endometriosis, unspecified: N80.9

## 2012-02-23 LAB — URINALYSIS, ROUTINE W REFLEX MICROSCOPIC
Bilirubin Urine: NEGATIVE
Glucose, UA: NEGATIVE mg/dL
Ketones, ur: NEGATIVE mg/dL
Nitrite: NEGATIVE
Specific Gravity, Urine: 1.02 (ref 1.005–1.030)
pH: 6 (ref 5.0–8.0)

## 2012-02-23 LAB — WET PREP, GENITAL: Trich, Wet Prep: NONE SEEN

## 2012-02-23 LAB — PREGNANCY, URINE: Preg Test, Ur: NEGATIVE

## 2012-02-23 LAB — URINE MICROSCOPIC-ADD ON

## 2012-02-23 LAB — POCT PREGNANCY, URINE: Preg Test, Ur: NEGATIVE

## 2012-02-23 MED ORDER — HYDROCORTISONE 2.5 % RE CREA: TOPICAL_CREAM | RECTAL | Status: AC

## 2012-02-23 MED ORDER — METRONIDAZOLE 500 MG PO TABS: 500.0000 mg | ORAL_TABLET | Freq: Two times a day (BID) | ORAL | Status: AC

## 2012-02-23 NOTE — ED Notes (Signed)
Pt d/c home in NAD. Pt voiced understanding of d/c instructions and follow up care.  

## 2012-02-23 NOTE — ED Notes (Signed)
MD at bedside. 

## 2012-02-23 NOTE — ED Provider Notes (Signed)
History     CSN: [id number]  Arrival date & time 02/23/12  2952   First MD Initiated Contact with Patient 02/23/12 [phone]      Chief Complaint  Patient presents with  . Abdominal Pain    (Consider location/radiation/quality/duration/timing/severity/associated sxs/prior treatment) HPI Comments: Patient with history of endometriosis, bacterial vaginosis and IBS presents today with chief complaint of anal itching and pelvic pain. She reports on Friday she was not able to fully clean herself after a bowel movement and has increasing itching and redness in the anal area. She has not tried anything to help with the itching. She also complains of lower abdominal pain and vaginal discharge that has been occuring for months. She has recently noticed brown spots in her vaginal area and her vaginal area is itchy. She denies dysuria, urgency and frequency. She reports one sexual partner in the last 9 months. She was diagnosed with bacterial vaginosis in January and finished her antibiotic treatment. She denies fever, chills, nausea, vomiting, change in bowel movement and blood in her stool.  Patient is a 41 y.o. female presenting with abdominal pain. The history is provided by the patient.  Abdominal Pain The primary symptoms of the illness include abdominal pain and vaginal discharge. The primary symptoms of the illness do not include fever, shortness of breath, nausea, vomiting, diarrhea, dysuria or vaginal bleeding.  The vaginal discharge is not associated with dysuria.  Symptoms associated with the illness do not include chills, urgency or frequency.    Past Medical History  Diagnosis Date  . SVT (supraventricular tachycardia)   . Hypertension   . Endometriosis   . Irritable bowel syndrome (IBS)     No past surgical history on file. No pertinent surgical history.   No family history on file. No pertinent family history.   History  Substance Use Topics  . Smoking status: Current Every Day  Smoker -- 1.0 packs/day    Types: Cigarettes  . Smokeless tobacco: Not on file  . Alcohol Use: Yes    OB History    Grav Para Term Preterm Abortions TAB SAB Ect Mult Living                  Review of Systems  Constitutional: Negative for fever, chills and appetite change.  HENT: Negative for sore throat and rhinorrhea.   Eyes: Negative for redness.  Respiratory: Negative for cough and shortness of breath.   Cardiovascular: Negative for chest pain.  Gastrointestinal: Positive for abdominal pain and rectal pain (itching). Negative for nausea, vomiting, diarrhea and blood in stool.       No change in bowel movements  Genitourinary: Positive for vaginal discharge. Negative for dysuria, urgency, frequency, flank pain, vaginal bleeding and difficulty urinating.  Musculoskeletal: Negative for myalgias.  Skin: Positive for color change. Negative for rash.  Neurological: Negative for headaches.    Allergies  Sulfa antibiotics  Home Medications   Current Outpatient Rx  Name Route Sig Dispense Refill  . GUAIFENESIN ER 600 MG PO TB12 Oral Take 1,200 mg by mouth 2 (two) times daily.    . ALEVE PO Oral Take 1 tablet by mouth daily as needed. For headaches due to high blood pressure    . HYDROCORTISONE 2.5 % RE CREA  Apply rectally 2 times daily 30 g 0  . METRONIDAZOLE 500 MG PO TABS Oral Take 1 tablet (500 mg total) by mouth 2 (two) times daily. 14 tablet 0    BP 157/98  Pulse 104  Temp 98.3 F (36.8 C) (Oral)  Resp 20  SpO2 98%  LMP 02/18/2012  Physical Exam  Nursing note and vitals reviewed. Constitutional: She appears well-developed and well-nourished.  HENT:  Head: Normocephalic and atraumatic.  Eyes: Conjunctivae normal are normal. Right eye exhibits no discharge. Left eye exhibits no discharge.  Neck: Normal range of motion. Neck supple.  Cardiovascular: Normal rate, regular rhythm and normal heart sounds.   Pulmonary/Chest: Effort normal and breath sounds normal.    Abdominal: Soft. There is no tenderness.  Genitourinary: Rectal exam shows no external hemorrhoid, no fissure and no tenderness. There is no rash or tenderness on the right labia. There is no rash or tenderness on the left labia. Cervix exhibits no motion tenderness, no discharge and no friability. Right adnexum displays no mass and no tenderness. Left adnexum displays no mass and no tenderness. No erythema, tenderness or bleeding around the vagina. No signs of injury around the vagina. Vaginal discharge found.       No ext hemorrhoids seen. Skin around rectum appears irritated with mild erythema. No cellulitis or abscess.   Lymphadenopathy:       Right: No inguinal adenopathy present.  Neurological: She is alert.  Skin: Skin is warm and dry.  Psychiatric: She has a normal mood and affect.    ED Course  Procedures (including critical care time)  Labs Reviewed  URINALYSIS, ROUTINE W REFLEX MICROSCOPIC - Abnormal; Notable for the following:    APPearance CLOUDY (*)     Hgb urine dipstick MODERATE (*)     All other components within normal limits  WET PREP, GENITAL - Abnormal; Notable for the following:    Clue Cells Wet Prep HPF POC MODERATE (*)     WBC, Wet Prep HPF POC RARE (*)     All other components within normal limits  URINE MICROSCOPIC-ADD ON - Abnormal; Notable for the following:    Squamous Epithelial / LPF FEW (*)     Casts HYALINE CASTS (*)     All other components within normal limits  GC/CHLAMYDIA PROBE AMP, GENITAL  PREGNANCY, URINE  POCT PREGNANCY, URINE  LAB REPORT - SCANNED   No results found.   1. Bacterial vaginosis     Patient seen and examined. Work-up initiated. Pelvic performed with nurse chaperone.   Vital signs reviewed and are as follows: Filed Vitals:   02/23/12 0916  BP: 157/98  Pulse: 104  Temp: 98.3 F (36.8 C)  Resp: 20   Patient informed of results. Will treat for BV and anal inflammation.   Patient urged to f/u with PCP, return if  other concerns.    MDM  Anal itching: treat with topical cortisone  BV, vaginal discharge: treat with abx, neg pregnancy        Renne Crigler, [place] 02/25/12 [phone]

## 2012-02-23 NOTE — ED Notes (Signed)
Pt presents to department for evaluation of lower abdominal pain. Ongoing for several days. States redness/irritation to rectum. Also states dark colored urine. Denies vaginal symptoms. Denies dysuria. 7/10 pain at the time. She is alert and oriented x4. Ambulatory to triage. No acute distress at present.

## 2012-02-25 NOTE — ED Provider Notes (Signed)
Medical screening examination/treatment/procedure(s) were performed by non-physician practitioner and as supervising physician I was immediately available for consultation/collaboration.   Joya Gaskins, MD 02/25/12 [phone]

## 2012-07-05 ENCOUNTER — Emergency Department (HOSPITAL_COMMUNITY)
Admission: EM | Admit: 2012-07-05 | Discharge: 2012-07-05 | Disposition: A | Discharge status: Home / Self Care | Payer: Self-pay | Attending: Emergency Medicine | Admitting: Emergency Medicine

## 2012-07-05 ENCOUNTER — Encounter (HOSPITAL_COMMUNITY): Payer: Self-pay | Admitting: Emergency Medicine

## 2012-07-05 DIAGNOSIS — N760 Acute vaginitis: Secondary | ICD-10-CM | POA: Insufficient documentation

## 2012-07-05 DIAGNOSIS — F17200 Nicotine dependence, unspecified, uncomplicated: Secondary | ICD-10-CM | POA: Insufficient documentation

## 2012-07-05 DIAGNOSIS — Z8679 Personal history of other diseases of the circulatory system: Secondary | ICD-10-CM | POA: Insufficient documentation

## 2012-07-05 DIAGNOSIS — L089 Local infection of the skin and subcutaneous tissue, unspecified: Secondary | ICD-10-CM | POA: Insufficient documentation

## 2012-07-05 DIAGNOSIS — I10 Essential (primary) hypertension: Secondary | ICD-10-CM | POA: Insufficient documentation

## 2012-07-05 DIAGNOSIS — Z8719 Personal history of other diseases of the digestive system: Secondary | ICD-10-CM | POA: Insufficient documentation

## 2012-07-05 DIAGNOSIS — Z8742 Personal history of other diseases of the female genital tract: Secondary | ICD-10-CM | POA: Insufficient documentation

## 2012-07-05 LAB — WET PREP, GENITAL

## 2012-07-05 MED ORDER — METRONIDAZOLE 500 MG PO TABS: 500.0000 mg | ORAL_TABLET | Freq: Two times a day (BID) | ORAL | Status: DC

## 2012-07-05 MED ORDER — DOXYCYCLINE HYCLATE 100 MG PO CAPS: 100.0000 mg | ORAL_CAPSULE | Freq: Two times a day (BID) | ORAL | Status: DC

## 2012-07-05 NOTE — ED Notes (Signed)
Pt. Stated, I've had a hair boil on my vagina area since November and I've been doctoring it but its coming back.  I'm also having some vaginal discharge. For about a week.

## 2012-07-05 NOTE — ED Provider Notes (Signed)
History     CSN: [id number]  Arrival date & time 07/05/12  1449   First MD Initiated Contact with Patient 07/05/12 1547      Chief Complaint  Patient presents with  . Vaginal Discharge    (Consider location/radiation/quality/duration/timing/severity/associated sxs/prior treatment) Patient is a 42 y.o. female presenting with vaginal discharge. The history is provided by the patient (pt complains of vaginal discharge and skin infectiion).  Vaginal Discharge This is a new problem. The current episode started more than 2 days ago. The problem occurs constantly. The problem has not changed since onset.Pertinent negatives include no chest pain, no abdominal pain and no headaches. Nothing aggravates the symptoms. Nothing relieves the symptoms.    Past Medical History  Diagnosis Date  . SVT (supraventricular tachycardia)   . Hypertension   . Endometriosis   . Irritable bowel syndrome (IBS)     History reviewed. No pertinent past surgical history.  No family history on file.  History  Substance Use Topics  . Smoking status: Current Every Day Smoker -- 1.0 packs/day    Types: Cigarettes  . Smokeless tobacco: Not on file  . Alcohol Use: Yes    OB History    Grav Para Term Preterm Abortions TAB SAB Ect Mult Living                  Review of Systems  Constitutional: Negative for fatigue.  HENT: Negative for congestion, sinus pressure and ear discharge.   Eyes: Negative for discharge.  Respiratory: Negative for cough.   Cardiovascular: Negative for chest pain.  Gastrointestinal: Negative for abdominal pain and diarrhea.  Genitourinary: Positive for vaginal discharge. Negative for frequency and hematuria.  Musculoskeletal: Negative for back pain.  Skin: Negative for rash.  Neurological: Negative for seizures and headaches.  Hematological: Negative.   Psychiatric/Behavioral: Negative for hallucinations.    Allergies  Sulfa antibiotics  Home Medications   Current  Outpatient Rx  Name  Route  Sig  Dispense  Refill  . NAPROXEN SODIUM 220 MG PO TABS   Oral   Take 220 mg by mouth 2 (two) times daily as needed. For pain         . DOXYCYCLINE HYCLATE 100 MG PO CAPS   Oral   Take 1 capsule (100 mg total) by mouth 2 (two) times daily.   20 capsule   0   . METRONIDAZOLE 500 MG PO TABS   Oral   Take 1 tablet (500 mg total) by mouth 2 (two) times daily. One po bid x 7 days   14 tablet   0     BP 129/99  Pulse 81  Temp 98.7 F (37.1 C) (Oral)  Resp 18  SpO2 95%  LMP 06/12/2012  Physical Exam  Constitutional: She is oriented to person, place, and time. She appears well-developed.  HENT:  Head: Normocephalic and atraumatic.  Eyes: Conjunctivae normal and EOM are normal. No scleral icterus.  Neck: Neck supple. No thyromegaly present.  Cardiovascular: Normal rate and regular rhythm.  Exam reveals no gallop and no friction rub.   No murmur heard. Pulmonary/Chest: No stridor. She has no wheezes. She has no rales. She exhibits no tenderness.  Abdominal: She exhibits no distension. There is no tenderness. There is no rebound.  Genitourinary:       Small skin infection to mons pubis and mild vag discharge  Musculoskeletal: Normal range of motion. She exhibits no edema.  Lymphadenopathy:    She has no cervical adenopathy.  Neurological: She is oriented to person, place, and time. Coordination normal.  Skin: No rash noted. No erythema.  Psychiatric: She has a normal mood and affect. Her behavior is normal.    ED Course  Procedures (including critical care time)  Labs Reviewed  WET PREP, GENITAL - Abnormal; Notable for the following:    Clue Cells Wet Prep HPF POC MODERATE (*)     WBC, Wet Prep HPF POC FEW (*)     All other components within normal limits  GC/CHLAMYDIA PROBE AMP   No results found.   1. Vaginitis   2. Skin infection       MDM          Benny Lennert, MD 07/05/12 1753

## 2012-11-25 ENCOUNTER — Emergency Department (HOSPITAL_COMMUNITY)
Admission: EM | Admit: 2012-11-25 | Discharge: 2012-11-25 | Disposition: A | Discharge status: Home / Self Care | Payer: Self-pay | Attending: Emergency Medicine | Admitting: Emergency Medicine

## 2012-11-25 ENCOUNTER — Encounter (HOSPITAL_COMMUNITY): Payer: Self-pay

## 2012-11-25 DIAGNOSIS — R319 Hematuria, unspecified: Secondary | ICD-10-CM | POA: Insufficient documentation

## 2012-11-25 DIAGNOSIS — Z3202 Encounter for pregnancy test, result negative: Secondary | ICD-10-CM | POA: Insufficient documentation

## 2012-11-25 DIAGNOSIS — Z8679 Personal history of other diseases of the circulatory system: Secondary | ICD-10-CM | POA: Insufficient documentation

## 2012-11-25 DIAGNOSIS — J069 Acute upper respiratory infection, unspecified: Secondary | ICD-10-CM | POA: Insufficient documentation

## 2012-11-25 DIAGNOSIS — R109 Unspecified abdominal pain: Secondary | ICD-10-CM | POA: Insufficient documentation

## 2012-11-25 DIAGNOSIS — F17200 Nicotine dependence, unspecified, uncomplicated: Secondary | ICD-10-CM | POA: Insufficient documentation

## 2012-11-25 DIAGNOSIS — I10 Essential (primary) hypertension: Secondary | ICD-10-CM | POA: Insufficient documentation

## 2012-11-25 DIAGNOSIS — N898 Other specified noninflammatory disorders of vagina: Secondary | ICD-10-CM | POA: Insufficient documentation

## 2012-11-25 DIAGNOSIS — Z882 Allergy status to sulfonamides status: Secondary | ICD-10-CM | POA: Insufficient documentation

## 2012-11-25 DIAGNOSIS — R51 Headache: Secondary | ICD-10-CM | POA: Insufficient documentation

## 2012-11-25 DIAGNOSIS — Z8719 Personal history of other diseases of the digestive system: Secondary | ICD-10-CM | POA: Insufficient documentation

## 2012-11-25 DIAGNOSIS — Z8742 Personal history of other diseases of the female genital tract: Secondary | ICD-10-CM | POA: Insufficient documentation

## 2012-11-25 LAB — URINE MICROSCOPIC-ADD ON

## 2012-11-25 LAB — URINALYSIS, ROUTINE W REFLEX MICROSCOPIC
Nitrite: NEGATIVE
Specific Gravity, Urine: 1.024 (ref 1.005–1.030)
pH: 6 (ref 5.0–8.0)

## 2012-11-25 LAB — WET PREP, GENITAL
Clue Cells Wet Prep HPF POC: NONE SEEN
Trich, Wet Prep: NONE SEEN

## 2012-11-25 MED ORDER — AZITHROMYCIN 250 MG PO TABS
1000.0000 mg | ORAL_TABLET | Freq: Once | ORAL | Status: AC
  Administered 2012-11-25: 1000 mg via ORAL
  Filled 2012-11-25: qty 4

## 2012-11-25 MED ORDER — KETOROLAC TROMETHAMINE 60 MG/2ML IM SOLN
60.0000 mg | Freq: Once | INTRAMUSCULAR | Status: AC
  Administered 2012-11-25: 60 mg via INTRAMUSCULAR
  Filled 2012-11-25: qty 2

## 2012-11-25 MED ORDER — LIDOCAINE HCL (PF) 1 % IJ SOLN
INTRAMUSCULAR | Status: AC
  Administered 2012-11-25: 1 mL
  Filled 2012-11-25: qty 5

## 2012-11-25 MED ORDER — CEFTRIAXONE SODIUM 250 MG IJ SOLR
250.0000 mg | Freq: Once | INTRAMUSCULAR | Status: AC
  Administered 2012-11-25: 250 mg via INTRAMUSCULAR
  Filled 2012-11-25: qty 250

## 2012-11-25 NOTE — ED Provider Notes (Signed)
History     CSN: [id number]  Arrival date & time 11/25/12  0541   First MD Initiated Contact with Patient 11/25/12 [phone]      Chief Complaint  Patient presents with  . Vaginal Discharge  . Headache    (Consider location/radiation/quality/duration/timing/severity/associated sxs/prior treatment) HPI Comments: Patient is a 42 year old female who presents today with complaints of sinus headache and vaginal discharge. The sinus headache has been gradually worsening for the past 3 days. It is a pressure and throbbing in her frontal and maxillary sinuses. She reports a worsening cough which is productive with mucus. She has been taking Mucinex DM without any relief. She states she has not been doing anything including eating or drinking. She has been laying in a dark room and doing nothing for the past 2 days. Her nose is dry so she believes her nose must have been running in her sleep. She is concerned that this sinus headache is raising her blood pressure and that it will give her a migraine. Additional complaint of vaginal discharge which began yesterday. She states she noticed a "glunk" of white discharge yesterday. Since then she has been experiencing burning in her vaginal area. Today she began to itch. She has a history of yeast and bacterial infections and is concerned she is developing an infection. She has also developed a sharp, stabbing pain in her left lower quadrant that recently migrated to her right lower quadrant. She is sexually active in a new relationship. Last menstrual period was on 11/08/12. Denies fevers, chills, nausea, vomiting, diarrhea, dysuria, urinary urgency, urinary frequency.  The history is provided by the patient. No language interpreter was used.    Past Medical History  Diagnosis Date  . SVT (supraventricular tachycardia)   . Hypertension   . Endometriosis   . Irritable bowel syndrome (IBS)     History reviewed. No pertinent past surgical history.  No family  history on file.  History  Substance Use Topics  . Smoking status: Current Every Day Smoker -- 1.00 packs/day    Types: Cigarettes  . Smokeless tobacco: Not on file  . Alcohol Use: Yes    OB History   Grav Para Term Preterm Abortions TAB SAB Ect Mult Living                  Review of Systems  Constitutional: Negative for fever and chills.  HENT: Positive for congestion, rhinorrhea and sinus pressure.   Respiratory: Positive for cough. Negative for shortness of breath.   Cardiovascular: Negative for chest pain.  Genitourinary: Positive for vaginal discharge and vaginal pain. Negative for dysuria and urgency.  All other systems reviewed and are negative.    Allergies  Sulfa antibiotics  Home Medications   Current Outpatient Rx  Name  Route  Sig  Dispense  Refill  . guaiFENesin (MUCINEX) 600 MG 12 hr tablet   Oral   Take 1,200 mg by mouth 2 (two) times daily as needed for congestion.           BP 139/93  Pulse 94  Temp(Src) 98.1 F (36.7 C) (Oral)  Resp 18  Ht 4\' 11"  (1.499 m)  Wt 106 lb (48.081 kg)  BMI 21.4 kg/m2  SpO2 99%  LMP 11/08/2012  Physical Exam  Nursing note and vitals reviewed. Constitutional: She is oriented to person, place, and time. She appears well-developed and well-nourished. She does not appear ill. No distress.  HENT:  Head: Normocephalic and atraumatic.  Right Ear: Tympanic membrane,  external ear and ear canal normal.  Left Ear: Tympanic membrane, external ear and ear canal normal.  Nose: Rhinorrhea present. No nasal deformity or septal deviation. Right sinus exhibits maxillary sinus tenderness and frontal sinus tenderness. Left sinus exhibits maxillary sinus tenderness and frontal sinus tenderness.  Mouth/Throat: Oropharynx is clear and moist.  Eyes: Conjunctivae are normal.  Neck: Normal range of motion.  Cardiovascular: Normal rate, regular rhythm, normal heart sounds and intact distal pulses.   Pulmonary/Chest: Effort normal and  breath sounds normal. No stridor. No respiratory distress. She has no wheezes. She has no rales.  Abdominal: Soft. She exhibits no distension.  Genitourinary: Pelvic exam was performed with patient supine. No labial fusion. There is no rash, tenderness, lesion or injury on the right labia. There is no rash, tenderness, lesion or injury on the left labia. Cervix exhibits discharge (white). Cervix exhibits no motion tenderness and no friability. Right adnexum displays no mass, no tenderness and no fullness. Left adnexum displays no mass, no tenderness and no fullness. No erythema, tenderness or bleeding around the vagina. No foreign body around the vagina. No signs of injury around the vagina. Vaginal discharge (vaginal vault filled with white discharge) found.  Musculoskeletal: Normal range of motion.  Neurological: She is alert and oriented to person, place, and time. She has normal strength.  Skin: Skin is warm and dry. She is not diaphoretic. No erythema.  Psychiatric: She has a normal mood and affect. Her behavior is normal.    ED Course  Procedures (including critical care time)  Labs Reviewed  WET PREP, GENITAL - Abnormal; Notable for the following:    WBC, Wet Prep HPF POC FEW (*)    All other components within normal limits  URINALYSIS, ROUTINE W REFLEX MICROSCOPIC - Abnormal; Notable for the following:    Hgb urine dipstick LARGE (*)    Bilirubin Urine SMALL (*)    Ketones, ur 15 (*)    All other components within normal limits  URINE MICROSCOPIC-ADD ON - Abnormal; Notable for the following:    Bacteria, UA FEW (*)    All other components within normal limits  GC/CHLAMYDIA PROBE AMP  POCT PREGNANCY, URINE   No results found.   1. Vaginal discharge   2. URI (upper respiratory infection)   3. Hematuria       MDM  Patient is a 42 year old female presents today with upper respiratory infection as well as vaginal discharge. Wet prep negative for BV, Trichomonas, yeast.  Positive for bacteria. GC/Chlamydia pending. She has a new sexual partner and was treated prophylactically for GC/Chlamydia. No CMT. No concern for PID. Hematuria that appears chronic on UA. 3 days of URI that is likely viral. Follow up with your primary care physician. Return instructions given. Vital signs stable for discharge. Patient / Family / Caregiver informed of clinical course, understand medical decision-making process, and agree with plan.         Mora Bellman, PA-C 11/25/12 [phone]

## 2012-11-25 NOTE — ED Notes (Signed)
Pt provided with crackers, peanut butter, and soda.

## 2012-11-25 NOTE — ED Provider Notes (Signed)
Medical screening examination/treatment/procedure(s) were performed by non-physician practitioner and as supervising physician I was immediately available for consultation/collaboration.  Olivia Mackie, MD 11/25/12 2118

## 2012-11-25 NOTE — ED Notes (Signed)
PA at bedside.

## 2012-11-25 NOTE — ED Notes (Signed)
Pt is here with frontal headache and states has been treating self for sinus.  Pt reports noting white/gray vag discharge.  No pain or burning with urination.  Pt is sexually active and states that she has just started relationship.   LMP at the beginning of June.  Pt reports some lower abdominal pain.

## 2012-11-25 NOTE — ED Notes (Signed)
C/o headache, sinus infection, vaginal discharge x 2 days.  sts feels her bp is up this am.

## 2012-11-25 NOTE — ED Notes (Signed)
No change from triage assessment

## 2012-12-03 ENCOUNTER — Inpatient Hospital Stay (HOSPITAL_COMMUNITY): Payer: Self-pay

## 2012-12-03 ENCOUNTER — Inpatient Hospital Stay (HOSPITAL_COMMUNITY)
Admission: AD | Admit: 2012-12-03 | Discharge: 2012-12-03 | Disposition: A | Discharge status: Home / Self Care | Payer: Self-pay | Source: Ambulatory Visit | Attending: Obstetrics & Gynecology | Admitting: Obstetrics & Gynecology

## 2012-12-03 ENCOUNTER — Encounter (HOSPITAL_COMMUNITY): Payer: Self-pay | Admitting: *Deleted

## 2012-12-03 DIAGNOSIS — M549 Dorsalgia, unspecified: Secondary | ICD-10-CM | POA: Insufficient documentation

## 2012-12-03 DIAGNOSIS — R82998 Other abnormal findings in urine: Secondary | ICD-10-CM

## 2012-12-03 DIAGNOSIS — N949 Unspecified condition associated with female genital organs and menstrual cycle: Secondary | ICD-10-CM | POA: Insufficient documentation

## 2012-12-03 DIAGNOSIS — N390 Urinary tract infection, site not specified: Secondary | ICD-10-CM

## 2012-12-03 LAB — URINALYSIS, ROUTINE W REFLEX MICROSCOPIC
Leukocytes, UA: NEGATIVE
Nitrite: NEGATIVE
Specific Gravity, Urine: 1.02 (ref 1.005–1.030)
Urobilinogen, UA: 0.2 mg/dL (ref 0.0–1.0)

## 2012-12-03 LAB — URINE MICROSCOPIC-ADD ON

## 2012-12-03 MED ORDER — CIPROFLOXACIN HCL 500 MG PO TABS: 500.0000 mg | ORAL_TABLET | Freq: Two times a day (BID) | ORAL | Status: DC

## 2012-12-03 MED ORDER — HYDROCODONE-ACETAMINOPHEN 7.5-300 MG PO TABS: 1.0000 | ORAL_TABLET | Freq: Four times a day (QID) | ORAL | Status: DC | PRN

## 2012-12-03 NOTE — Progress Notes (Signed)
Pt states she took medication and decided she didn't wan to  Take medication

## 2012-12-03 NOTE — MAU Note (Signed)
Pt states she has had heavy vaginal x 3 days 11/29/2012. Pt states that bleeding  changed to brown discharge x1 day and stopped. Pt states she has pain in her back and"groin ".

## 2012-12-03 NOTE — MAU Provider Note (Signed)
History     CSN: [id number]  Arrival date and time: 12/03/12 1732   None     Chief Complaint  Patient presents with  . Back Pain  . Pelvic Pain   HPI This is a 42 y.o. female who presents with c/o irregular bleeding and pain in her back and groin/perineum.  States period was a few days late. Has not missed any cycles . No fever. No nausea or vomiting.   RN Note: Pt states she has had heavy vaginal x 3 days 11/29/2012. Pt states that bleeding changed to brown discharge x1 day and stopped. Pt states she has pain in her back and"groin ".       OB History   Grav Para Term Preterm Abortions TAB SAB Ect Mult Living   2 2 2  0 0 0 0 0 0 2      Past Medical History  Diagnosis Date  . SVT (supraventricular tachycardia)   . Hypertension   . Endometriosis   . Irritable bowel syndrome (IBS)     Past Surgical History  Procedure Laterality Date  . Laparoscopy      Family History  Problem Relation Age of Onset  . Diabetes Mother   . Diabetes Father   . Hypertension Sister   . Hypertension Brother     History  Substance Use Topics  . Smoking status: Current Every Day Smoker -- 1.00 packs/day    Types: Cigarettes  . Smokeless tobacco: Not on file  . Alcohol Use: Yes    Allergies:  Allergies  Allergen Reactions  . Sulfa Antibiotics Rash    Prescriptions prior to admission  Medication Sig Dispense Refill  . naproxen sodium (ALEVE) 220 MG tablet Take 220 mg by mouth once.        Review of Systems  Constitutional: Negative for fever, chills and malaise/fatigue.  Gastrointestinal: Positive for abdominal pain. Negative for heartburn, nausea, vomiting, diarrhea and constipation.  Genitourinary: Negative for dysuria.  Neurological: Negative for dizziness, focal weakness and headaches.   Physical Exam   Last menstrual period 11/08/2012.  Physical Exam  Constitutional: She is oriented to person, place, and time. She appears well-developed and well-nourished. No  distress.  HENT:  Head: Normocephalic.  Cardiovascular: Normal rate.   Respiratory: Effort normal.  GI: Soft. She exhibits no distension and no mass. There is tenderness (slight midabdomen tenderness). There is no rebound and no guarding.  Genitourinary: Vagina normal and uterus normal. No vaginal discharge found.  Musculoskeletal: Normal range of motion.  Neurological: She is alert and oriented to person, place, and time.  Skin: Skin is warm and dry.  Psychiatric: She has a normal mood and affect.    MAU Course  Procedures  MDM Results for orders placed during the hospital encounter of 12/03/12 (from the past 24 hour(s))  URINALYSIS, ROUTINE W REFLEX MICROSCOPIC     Status: Abnormal   Collection Time    12/03/12  6:10 PM      Result Value Range   Color, Urine YELLOW  YELLOW   APPearance HAZY (*) CLEAR   Specific Gravity, Urine 1.020  1.005 - 1.030   pH 7.0  5.0 - 8.0   Glucose, UA NEGATIVE  NEGATIVE mg/dL   Hgb urine dipstick SMALL (*) NEGATIVE   Bilirubin Urine NEGATIVE  NEGATIVE   Ketones, ur NEGATIVE  NEGATIVE mg/dL   Protein, ur NEGATIVE  NEGATIVE mg/dL   Urobilinogen, UA 0.2  0.0 - 1.0 mg/dL   Nitrite NEGATIVE  NEGATIVE  Leukocytes, UA NEGATIVE  NEGATIVE  URINE MICROSCOPIC-ADD ON     Status: Abnormal   Collection Time    12/03/12  6:10 PM      Result Value Range   Squamous Epithelial / LPF RARE  RARE   WBC, UA 0-2  <3 WBC/hpf   RBC / HPF 3-6  <3 RBC/hpf   Bacteria, UA FEW (*) RARE   Crystals CA OXALATE CRYSTALS (*) NEGATIVE   Urine-Other AMORPHOUS URATES/PHOSPHATES     US Pelvis Complete  12/03/2012   *RADIOLOGY REPORT*  Clinical Data: Vaginal bleeding.  TRANSABDOMINAL AND TRANSVAGINAL ULTRASOUND OF PELVIS Technique:  Both transabdominal and transvaginal ultrasound examinations of the pelvis were performed. Transabdominal technique was performed for global imaging of the pelvis including uterus, ovaries, adnexal regions, and pelvic cul-de-sac.  It was necessary to  proceed with endovaginal exam following the transabdominal exam to visualize the uterus and ovaries.  Comparison:  None  Findings:  Uterus: Normal in size and appearance  Endometrium: 2.5 mm.  No focal endometrial lesion.  Right ovary:  Normal appearance/no adnexal mass  Left ovary: Normal appearance/no adnexal mass  Other findings: No free fluid    IMPRESSION: Normal study. No evidence of pelvic mass or other significant abnormality.     Original Report Authenticated By: Janeece Riggers, M.D.    Assessment and Plan  A:  Pelvic pain      Possible UTI      Possible irritation from urinary crystals  P:  Discharge       Rx Cipro to cover UTI, urine to culture       Push fluids to clear crystals  [facility] 12/03/2012, 6:21 PM

## 2012-12-03 NOTE — Progress Notes (Signed)
Pt instructed to follow up with her MD and get back a medication

## 2014-04-11 ENCOUNTER — Encounter (HOSPITAL_COMMUNITY): Payer: Self-pay | Admitting: *Deleted

## 2014-12-05 ENCOUNTER — Emergency Department (HOSPITAL_COMMUNITY): Payer: Self-pay

## 2014-12-05 ENCOUNTER — Emergency Department (HOSPITAL_COMMUNITY)
Admission: EM | Admit: 2014-12-05 | Discharge: 2014-12-05 | Disposition: A | Discharge status: Home / Self Care | Payer: Self-pay | Attending: Emergency Medicine | Admitting: Emergency Medicine

## 2014-12-05 ENCOUNTER — Encounter (HOSPITAL_COMMUNITY): Payer: Self-pay | Admitting: Emergency Medicine

## 2014-12-05 DIAGNOSIS — S56911A Strain of unspecified muscles, fascia and tendons at forearm level, right arm, initial encounter: Secondary | ICD-10-CM | POA: Insufficient documentation

## 2014-12-05 DIAGNOSIS — S5011XA Contusion of right forearm, initial encounter: Secondary | ICD-10-CM | POA: Insufficient documentation

## 2014-12-05 DIAGNOSIS — W1839XA Other fall on same level, initial encounter: Secondary | ICD-10-CM | POA: Insufficient documentation

## 2014-12-05 DIAGNOSIS — S8991XA Unspecified injury of right lower leg, initial encounter: Secondary | ICD-10-CM | POA: Insufficient documentation

## 2014-12-05 DIAGNOSIS — I10 Essential (primary) hypertension: Secondary | ICD-10-CM | POA: Insufficient documentation

## 2014-12-05 DIAGNOSIS — Z79899 Other long term (current) drug therapy: Secondary | ICD-10-CM | POA: Insufficient documentation

## 2014-12-05 DIAGNOSIS — Z720 Tobacco use: Secondary | ICD-10-CM | POA: Insufficient documentation

## 2014-12-05 DIAGNOSIS — Y9389 Activity, other specified: Secondary | ICD-10-CM | POA: Insufficient documentation

## 2014-12-05 DIAGNOSIS — Z8742 Personal history of other diseases of the female genital tract: Secondary | ICD-10-CM | POA: Insufficient documentation

## 2014-12-05 DIAGNOSIS — Y9289 Other specified places as the place of occurrence of the external cause: Secondary | ICD-10-CM | POA: Insufficient documentation

## 2014-12-05 DIAGNOSIS — Z8719 Personal history of other diseases of the digestive system: Secondary | ICD-10-CM | POA: Insufficient documentation

## 2014-12-05 DIAGNOSIS — S3992XA Unspecified injury of lower back, initial encounter: Secondary | ICD-10-CM | POA: Insufficient documentation

## 2014-12-05 DIAGNOSIS — T148XXA Other injury of unspecified body region, initial encounter: Secondary | ICD-10-CM

## 2014-12-05 DIAGNOSIS — Y998 Other external cause status: Secondary | ICD-10-CM | POA: Insufficient documentation

## 2014-12-05 MED ORDER — IBUPROFEN 800 MG PO TABS: 800.0000 mg | ORAL_TABLET | Freq: Three times a day (TID) | ORAL | Status: DC

## 2014-12-05 NOTE — ED Notes (Signed)
Patient states fell x 7 days ago down a flight of steps.   Patient states has knot, swelling and bruising on R forearm.   Patient states stinging and burning in arm.

## 2014-12-05 NOTE — ED Provider Notes (Signed)
CSN: [id number]     Arrival date & time 12/05/14  0905 History  This chart was scribed for non-physician practitioner, Roxy Horseman, PA-C working with Nelva Nay, MD by Placido Sou, ED scribe. This patient was seen in room TR06C/TR06C and the patient's care was started at 9:46 AM.    No chief complaint on file.  The history is provided by the patient. No language interpreter was used.    HPI Comments: Becky Vargas is a 44 y.o. female who presents to the Emergency Department complaining of a fall that occurred 1 week ago. Pt notes moderate, constant, swelling and pain to the posterior of her right forearm with an additional abrasion to the affected area. She further notes mild pain to her lower right leg and right buttock from the same fall. She notes TTP to the lower right arm and a worsening of symptoms when stretching or lifting and describes the pain as a "stinging and burning". She notes icing the affected area as well as taking Aleve as needed with little relief of symptoms. She denies any other associated symptoms.   Past Medical History  Diagnosis Date  . SVT (supraventricular tachycardia)   . Hypertension   . Endometriosis   . Irritable bowel syndrome (IBS)    Past Surgical History  Procedure Laterality Date  . Laparoscopy     Family History  Problem Relation Age of Onset  . Diabetes Mother   . Diabetes Father   . Hypertension Sister   . Hypertension Brother    History  Substance Use Topics  . Smoking status: Current Every Day Smoker -- 1.00 packs/day    Types: Cigarettes  . Smokeless tobacco: Not on file  . Alcohol Use: Yes   OB History    Gravida Para Term Preterm AB TAB SAB Ectopic Multiple Living   0 0 0 0 0 0 2     Review of Systems  Musculoskeletal: Positive for myalgias and arthralgias.  Skin: Positive for wound. Negative for color change and pallor.      Allergies  Sulfa antibiotics  Home Medications   Prior to Admission  medications   Medication Sig Start Date End Date Taking? Authorizing Provider  ciprofloxacin (CIPRO) 500 MG tablet Take 1 tablet (500 mg total) by mouth 2 (two) times daily. 12/03/12   Aviva Signs, CNM  Hydrocodone-Acetaminophen (VICODIN ES) 7.5-300 MG TABS Take 1 tablet by mouth every 6 (six) hours as needed. 12/03/12   Aviva Signs, CNM  naproxen sodium (ALEVE) 220 MG tablet Take 220 mg by mouth once.    Historical Provider, MD   There were no vitals taken for this visit. Physical Exam  Constitutional: She is oriented to person, place, and time. She appears well-developed and well-nourished. No distress.  HENT:  Head: Normocephalic and atraumatic.  Mouth/Throat: Oropharynx is clear and moist.  Eyes: Conjunctivae and EOM are normal. Pupils are equal, round, and reactive to light.  Neck: Normal range of motion. Neck supple. No tracheal deviation present.  Cardiovascular: Normal rate and intact distal pulses.   Intact distal pulses with brisk capillary refill  Pulmonary/Chest: Effort normal and breath sounds normal. No respiratory distress.  Abdominal: Soft. She exhibits no distension.  Musculoskeletal: Normal range of motion. She exhibits tenderness.  Contusion of right anterior upper forearm, there is a small mass near the proximal anterior aspect of the ulna, which could be a small muscle strain/tear, distal strength is intact  Neurological: She is alert  and oriented to person, place, and time.  Skin: Skin is warm and dry.  Contusion of right anterior forearm  Psychiatric: She has a normal mood and affect. Her behavior is normal.  Nursing note and vitals reviewed.   ED Course  Procedures  DIAGNOSTIC STUDIES: Oxygen Saturation is 99% on RA, normal by my interpretation.    COORDINATION OF CARE: 9:52 AM Discussed treatment plan with pt at bedside and pt agreed to plan.  Labs Review Labs Reviewed - No data to display  Imaging Review No results found.   EKG  Interpretation None      MDM   Final diagnoses:  Hematoma  Muscle strain    Patient has significant hematoma to right anterior forearm, it is moderately tender to palpation, there is no bony abnormality or deformity, the right arm is not swollen, pulses are intact, distal sensation and strength is intact. I suspect that the pain is secondary to the hematoma, and doubt fracture or upper extremity DVT. Plain films are negative for fracture. Will encourage continued warm compresses, NSAIDs, and hand follow-up.  I personally performed the services described in this documentation, which was scribed in my presence. The recorded information has been reviewed and is accurate.     Roxy Horsemanobert Zaynab Chipman, PA-C 12/05/14 1034  Nelva Nayobert Beaton, MD 12/06/14 [phone]

## 2014-12-05 NOTE — Discharge Instructions (Signed)
Hematoma A hematoma is a collection of blood under the skin, in an organ, in a body space, in a joint space, or in other tissue. The blood can clot to form a lump that you can see and feel. The lump is often firm and may sometimes become sore and tender. Most hematomas get better in a few days to weeks. However, some hematomas may be serious and require medical care. Hematomas can range in size from very small to very large. CAUSES  A hematoma can be caused by a blunt or penetrating injury. It can also be caused by spontaneous leakage from a blood vessel under the skin. Spontaneous leakage from a blood vessel is more likely to occur in older people, especially those taking blood thinners. Sometimes, a hematoma can develop after certain medical procedures. SIGNS AND SYMPTOMS   A firm lump on the body.  Possible pain and tenderness in the area.  Bruising.Blue, dark blue, purple-red, or yellowish skin may appear at the site of the hematoma if the hematoma is close to the surface of the skin. For hematomas in deeper tissues or body spaces, the signs and symptoms may be subtle. For example, an intra-abdominal hematoma may cause abdominal pain, weakness, fainting, and shortness of breath. An intracranial hematoma may cause a headache or symptoms such as weakness, trouble speaking, or a change in consciousness. DIAGNOSIS  A hematoma can usually be diagnosed based on your medical history and a physical exam. Imaging tests may be needed if your health care provider suspects a hematoma in deeper tissues or body spaces, such as the abdomen, head, or chest. These tests may include ultrasonography or a CT scan.  TREATMENT  Hematomas usually go away on their own over time. Rarely does the blood need to be drained out of the body. Large hematomas or those that may affect vital organs will sometimes need surgical drainage or monitoring. HOME CARE INSTRUCTIONS   Apply ice to the injured area:   Put ice in a  plastic bag.   Place a towel between your skin and the bag.   Leave the ice on for 20 minutes, 2-3 times a day for the first 1 to 2 days.   After the first 2 days, switch to using warm compresses on the hematoma.   Elevate the injured area to help decrease pain and swelling. Wrapping the area with an elastic bandage may also be helpful. Compression helps to reduce swelling and promotes shrinking of the hematoma. Make sure the bandage is not wrapped too tight.   If your hematoma is on a lower extremity and is painful, crutches may be helpful for a couple days.   Only take over-the-counter or prescription medicines as directed by your health care provider. SEEK IMMEDIATE MEDICAL CARE IF:   You have increasing pain, or your pain is not controlled with medicine.   You have a fever.   You have worsening swelling or discoloration.   Your skin over the hematoma breaks or starts bleeding.   Your hematoma is in your chest or abdomen and you have weakness, shortness of breath, or a change in consciousness.  Your hematoma is on your scalp (caused by a fall or injury) and you have a worsening headache or a change in alertness or consciousness. MAKE SURE YOU:   Understand these instructions.  Will watch your condition.  Will get help right away if you are not doing well or get worse. Document Released: 01/09/2004 Document Revised: 01/27/2013 Document Reviewed: 11/04/2012   [employer] Patient Information 2015 [place], [place]. This information is not intended to replace advice given to you by your health care provider. Make sure you discuss any questions you have with your health care provider. Muscle Strain A muscle strain is an injury that occurs when a muscle is stretched beyond its normal length. Usually a small number of muscle fibers are torn when this happens. Muscle strain is rated in degrees. First-degree strains have the least amount of muscle fiber tearing and pain.  Second-degree and third-degree strains have increasingly more tearing and pain.  Usually, recovery from muscle strain takes 1-2 weeks. Complete healing takes 5-6 weeks.  CAUSES  Muscle strain happens when a sudden, violent force placed on a muscle stretches it too far. This may occur with lifting, sports, or a fall.  RISK FACTORS Muscle strain is especially common in athletes.  SIGNS AND SYMPTOMS At the site of the muscle strain, there may be:  Pain.  Bruising.  Swelling.  Difficulty using the muscle due to pain or lack of normal function. DIAGNOSIS  Your health care provider will perform a physical exam and ask about your medical history. TREATMENT  Often, the best treatment for a muscle strain is resting, icing, and applying cold compresses to the injured area.  HOME CARE INSTRUCTIONS   Use the PRICE method of treatment to promote muscle healing during the first 2-3 days after your injury. The PRICE method involves:  Protecting the muscle from being injured again.  Restricting your activity and resting the injured body part.  Icing your injury. To do this, put ice in a plastic bag. Place a towel between your skin and the bag. Then, apply the ice and leave it on from 15-20 minutes each hour. After the third day, switch to moist heat packs.  Apply compression to the injured area with a splint or elastic bandage. Be careful not to wrap it too tightly. This may interfere with blood circulation or increase swelling.  Elevate the injured body part above the level of your heart as often as you can.  Only take over-the-counter or prescription medicines for pain, discomfort, or fever as directed by your health care provider.  Warming up prior to exercise helps to prevent future muscle strains. SEEK MEDICAL CARE IF:   You have increasing pain or swelling in the injured area.  You have numbness, tingling, or a significant loss of strength in the injured area. MAKE SURE YOU:    Understand these instructions.  Will watch your condition.  Will get help right away if you are not doing well or get worse. Document Released: 05/27/2005 Document Revised: 03/17/2013 Document Reviewed: 12/24/2012 [facility] Patient Information 2015 [place], [place]. This information is not intended to replace advice given to you by your health care provider. Make sure you discuss any questions you have with your health care provider.

## 2015-03-06 ENCOUNTER — Encounter (HOSPITAL_COMMUNITY): Payer: Self-pay | Admitting: Family Medicine

## 2015-03-06 ENCOUNTER — Emergency Department (HOSPITAL_COMMUNITY)
Admission: EM | Admit: 2015-03-06 | Discharge: 2015-03-06 | Disposition: A | Discharge status: Home / Self Care | Payer: Medicaid Other | Attending: Emergency Medicine | Admitting: Emergency Medicine

## 2015-03-06 ENCOUNTER — Emergency Department (HOSPITAL_COMMUNITY): Payer: Medicaid Other

## 2015-03-06 DIAGNOSIS — R1084 Generalized abdominal pain: Secondary | ICD-10-CM | POA: Diagnosis present

## 2015-03-06 DIAGNOSIS — Z791 Long term (current) use of non-steroidal anti-inflammatories (NSAID): Secondary | ICD-10-CM | POA: Insufficient documentation

## 2015-03-06 DIAGNOSIS — Z3202 Encounter for pregnancy test, result negative: Secondary | ICD-10-CM | POA: Diagnosis not present

## 2015-03-06 DIAGNOSIS — I10 Essential (primary) hypertension: Secondary | ICD-10-CM | POA: Diagnosis not present

## 2015-03-06 DIAGNOSIS — Z720 Tobacco use: Secondary | ICD-10-CM | POA: Insufficient documentation

## 2015-03-06 DIAGNOSIS — K529 Noninfective gastroenteritis and colitis, unspecified: Secondary | ICD-10-CM

## 2015-03-06 LAB — COMPREHENSIVE METABOLIC PANEL
ALBUMIN: 3.9 g/dL (ref 3.5–5.0)
ALT: 11 U/L — AB (ref 14–54)
AST: 20 U/L (ref 15–41)
Alkaline Phosphatase: 56 U/L (ref 38–126)
Anion gap: 8 (ref 5–15)
BUN: 8 mg/dL (ref 6–20)
CHLORIDE: 105 mmol/L (ref 101–111)
CO2: 25 mmol/L (ref 22–32)
CREATININE: 0.73 mg/dL (ref 0.44–1.00)
Calcium: 8.9 mg/dL (ref 8.9–10.3)
GFR calc Af Amer: >60 mL/min (ref >60)
GFR calc non Af Amer: >60 mL/min (ref >60)
GLUCOSE: 130 mg/dL — AB (ref 65–99)
POTASSIUM: 3.5 mmol/L (ref 3.5–5.1)
SODIUM: 138 mmol/L (ref 135–145)
Total Bilirubin: 0.5 mg/dL (ref 0.3–1.2)
Total Protein: 6.8 g/dL (ref 6.5–8.1)

## 2015-03-06 LAB — URINALYSIS, ROUTINE W REFLEX MICROSCOPIC
BILIRUBIN URINE: NEGATIVE
GLUCOSE, UA: NEGATIVE mg/dL
Ketones, ur: NEGATIVE mg/dL
Leukocytes, UA: NEGATIVE
NITRITE: NEGATIVE
PH: 7.5 (ref 5.0–8.0)
Protein, ur: NEGATIVE mg/dL
SPECIFIC GRAVITY, URINE: 1.019 (ref 1.005–1.030)
Urobilinogen, UA: 0.2 mg/dL (ref 0.0–1.0)

## 2015-03-06 LAB — CBC
HEMATOCRIT: 38.5 % (ref 36.0–46.0)
Hemoglobin: 13.6 g/dL (ref 12.0–15.0)
MCH: 34.3 pg — AB (ref 26.0–34.0)
MCHC: 35.3 g/dL (ref 30.0–36.0)
MCV: 97 fL (ref 78.0–100.0)
PLATELETS: 264 10*3/uL (ref 150–400)
RBC: 3.97 MIL/uL (ref 3.87–5.11)
RDW: 12.7 % (ref 11.5–15.5)
WBC: 5.5 10*3/uL (ref 4.0–10.5)

## 2015-03-06 LAB — URINE MICROSCOPIC-ADD ON

## 2015-03-06 LAB — LIPASE, BLOOD: LIPASE: 23 U/L (ref 22–51)

## 2015-03-06 LAB — POC URINE PREG, ED: PREG TEST UR: NEGATIVE

## 2015-03-06 MED ORDER — ONDANSETRON HCL 4 MG/2ML IJ SOLN
4.0000 mg | Freq: Once | INTRAMUSCULAR | Status: AC
  Administered 2015-03-06: 4 mg via INTRAVENOUS
  Filled 2015-03-06: qty 2

## 2015-03-06 MED ORDER — ONDANSETRON HCL 4 MG PO TABS: 4.0000 mg | ORAL_TABLET | Freq: Three times a day (TID) | ORAL | Status: DC | PRN

## 2015-03-06 MED ORDER — IOHEXOL 300 MG/ML  SOLN
80.0000 mL | Freq: Once | INTRAMUSCULAR | Status: AC | PRN
  Administered 2015-03-06: 100 mL via INTRAVENOUS

## 2015-03-06 MED ORDER — CIPROFLOXACIN HCL 500 MG PO TABS: 500.0000 mg | ORAL_TABLET | Freq: Two times a day (BID) | ORAL | Status: DC

## 2015-03-06 MED ORDER — MORPHINE SULFATE (PF) 4 MG/ML IV SOLN
4.0000 mg | Freq: Once | INTRAVENOUS | Status: AC
  Administered 2015-03-06: 4 mg via INTRAVENOUS
  Filled 2015-03-06: qty 1

## 2015-03-06 MED ORDER — HYDROCODONE-ACETAMINOPHEN 5-325 MG PO TABS: 1.0000 | ORAL_TABLET | Freq: Four times a day (QID) | ORAL | Status: DC | PRN

## 2015-03-06 MED ORDER — SODIUM CHLORIDE 0.9 % IV BOLUS (SEPSIS)
1000.0000 mL | Freq: Once | INTRAVENOUS | Status: AC
  Administered 2015-03-06: 1000 mL via INTRAVENOUS

## 2015-03-06 MED ORDER — IOHEXOL 300 MG/ML  SOLN
50.0000 mL | Freq: Once | INTRAMUSCULAR | Status: AC | PRN
  Administered 2015-03-06: 25 mL via ORAL

## 2015-03-06 MED ORDER — METRONIDAZOLE 500 MG PO TABS: 500.0000 mg | ORAL_TABLET | Freq: Two times a day (BID) | ORAL | Status: DC

## 2015-03-06 NOTE — Discharge Instructions (Signed)

## 2015-03-06 NOTE — ED Notes (Signed)
Patient transported to CT 

## 2015-03-06 NOTE — ED Provider Notes (Signed)
CSN: [id number]     Arrival date & time 03/06/15  1322 History   First MD Initiated Contact with Patient 03/06/15 1547     Chief Complaint  Patient presents with  . Abdominal Pain    Becky Vargas is a 44 y.o. female with a past medical history significant for hypertension, SVT, and irritable bowel syndrome who presents with 1 week of chills, abdominal pain, diarrhea, nausea, and rectal bleeding. The patient reports that she was diagnosed with IBS approximate 5 years ago and since that time has not had any problem's. The patient says that for the last week, she has had severe diffuse abdominal pain in all quadrants. The patient scrubbed the pain as severe as a 10 out of 10, cramping, and sharp. The patient says it is at a baseline of 3 out of 10 currently. The patient reports nausea but no vomiting. The patient does report that anytime she eats or drinks she has profuse diarrhea and abdominal pain. The patient says she has lost partially 4 pounds this week secondary to her symptoms. The patient denies any fevers but does report chills. The patient also says that she has had foul-smelling urine. The patient says that her bowel movements have been mucousy, liquidy, and has had blood intermixed. The patient says that it is extremely foul-smelling. The patient reports no chest pain, shortness of breath, back pain, or any other complaints today. The patient is uncomfortable during initial evaluation and she reports that she has only taken Aleve with no improvement of symptoms. The patient also reports that she is on the last day of her menstrual cycle currently.   (Consider location/radiation/quality/duration/timing/severity/associated sxs/prior Treatment) Patient is a 44 y.o. female presenting with abdominal pain. The history is provided by the patient and medical records. No language interpreter was used.  Abdominal Pain Pain location:  Generalized Pain quality: cramping and sharp   Pain radiates to:   Does not radiate Pain severity:  Severe Onset quality:  Gradual Duration:  1 week Timing:  Constant Progression:  Waxing and waning Chronicity:  New Context: not previous surgeries, not recent illness and not trauma   Relieved by:  Nothing Worsened by:  Eating Ineffective treatments:  None tried Associated symptoms: chills, diarrhea, fatigue and nausea   Associated symptoms: no chest pain, no constipation, no cough, no dysuria (foul smelling urine), no fever, no shortness of breath, no vaginal bleeding, no vaginal discharge and no vomiting   Diarrhea:    Quality:  Unusually odiferous, mucous, malodorous and blood-tinged   Severity:  Severe   Duration:  1 week   Timing:  Intermittent   Progression:  Unchanged Nausea:    Severity:  Moderate   Past Medical History  Diagnosis Date  . SVT (supraventricular tachycardia)   . Hypertension   . Endometriosis   . Irritable bowel syndrome (IBS)    Past Surgical History  Procedure Laterality Date  . Laparoscopy     Family History  Problem Relation Age of Onset  . Diabetes Mother   . Diabetes Father   . Hypertension Sister   . Hypertension Brother    Social History  Substance Use Topics  . Smoking status: Current Every Day Smoker -- 1.00 packs/day    Types: Cigarettes  . Smokeless tobacco: None  . Alcohol Use: Yes   OB History    Gravida Para Term Preterm AB TAB SAB Ectopic Multiple Living   0 0 0 0 0 0 2  Review of Systems  Constitutional: Positive for chills and fatigue. Negative for fever.  HENT: Negative for congestion.   Respiratory: Negative for cough, chest tightness, shortness of breath, wheezing and stridor.   Cardiovascular: Negative for chest pain.  Gastrointestinal: Positive for nausea, abdominal pain, diarrhea and blood in stool. Negative for vomiting, constipation, abdominal distention and rectal pain.  Genitourinary: Negative for dysuria (foul smelling urine), flank pain, vaginal bleeding and  vaginal discharge.  Musculoskeletal: Negative for back pain and neck pain.  Skin: Negative for rash and wound.  Neurological: Negative for weakness.  Psychiatric/Behavioral: Negative for confusion.  All other systems reviewed and are negative.     Allergies  Sulfa antibiotics  Home Medications   Prior to Admission medications   Medication Sig Start Date End Date Taking? Authorizing Provider  ciprofloxacin (CIPRO) 500 MG tablet Take 1 tablet (500 mg total) by mouth 2 (two) times daily. 12/03/12   Aviva Signs, CNM  Hydrocodone-Acetaminophen (VICODIN ES) 7.5-300 MG TABS Take 1 tablet by mouth every 6 (six) hours as needed. 12/03/12   Aviva Signs, CNM  ibuprofen (ADVIL,MOTRIN) 800 MG tablet Take 1 tablet (800 mg total) by mouth 3 (three) times daily. 12/05/14   Roxy Horseman, PA-C  naproxen sodium (ALEVE) 220 MG tablet Take 220 mg by mouth once.    Historical Provider, MD   BP 162/100 mmHg  Pulse 91  Temp(Src) 98.6 F (37 C) (Oral)  Resp 18  SpO2 98%  LMP 03/06/2015 Physical Exam  Constitutional: She is oriented to person, place, and time. She appears well-developed and well-nourished. No distress.  HENT:  Head: Normocephalic.  Eyes: Conjunctivae and EOM are normal. Pupils are equal, round, and reactive to light.  Neck: Normal range of motion.  Cardiovascular: Normal rate, regular rhythm and normal heart sounds.   No murmur heard. Pulmonary/Chest: Effort normal. No stridor. No respiratory distress. She has no wheezes. She exhibits no tenderness.  Abdominal: Soft. Bowel sounds are normal. She exhibits no distension. There is tenderness in the right upper quadrant, right lower quadrant, epigastric area, suprapubic area and left upper quadrant. There is no rebound and no guarding.    Musculoskeletal: She exhibits no tenderness.  Neurological: She is alert and oriented to person, place, and time. She exhibits normal muscle tone.  Skin: Skin is warm. She is not diaphoretic.  No erythema.  Psychiatric: She has a normal mood and affect.  Nursing note and vitals reviewed.   ED Course  Procedures (including critical care time) Labs Review Labs Reviewed  COMPREHENSIVE METABOLIC PANEL - Abnormal; Notable for the following:    Glucose, Bld 130 (*)    ALT 11 (*)    All other components within normal limits  CBC - Abnormal; Notable for the following:    MCH 34.3 (*)    All other components within normal limits  URINALYSIS, ROUTINE W REFLEX MICROSCOPIC (NOT AT [facility]) - Abnormal; Notable for the following:    APPearance TURBID (*)    Hgb urine dipstick LARGE (*)    All other components within normal limits  URINE MICROSCOPIC-ADD ON - Abnormal; Notable for the following:    Bacteria, UA FEW (*)    All other components within normal limits  LIPASE, BLOOD  POC URINE PREG, ED  POC URINE PREG, ED    Imaging Review Ct Abdomen Pelvis W Contrast  03/06/2015   CLINICAL DATA:  Patient has irritable bowel syndrome with pain and diarrhea since last Monday  EXAM: CT ABDOMEN AND  PELVIS WITH CONTRAST  TECHNIQUE: Multidetector CT imaging of the abdomen and pelvis was performed using the standard protocol following bolus administration of intravenous contrast.  CONTRAST:  80 mL OMNIPAQUE IOHEXOL 300 MG/ML  SOLN  COMPARISON:  None.  FINDINGS: There is a 4 mm cyst in the dome liver. The liver is otherwise normal. The spleen, pancreas, gallbladder, adrenal glands and kidneys are normal. The aorta demonstrate no aneurysmal dilatation. There is no abdominal lymphadenopathy. There is diffuse bowel wall thickening of the second portion of duodenum. There is no small bowel obstruction or diverticulitis.  Fluid-filled bladder is normal. The uterus is normal. The lung bases are clear. No acute abnormalities identified in the visualized bones.  IMPRESSION: Diffuse bowel wall thickening of the duodenum. The findings are consistent with duodenitis.   Electronically Signed   By: Sherian Rein M.D.    On: 03/06/2015 19:44   I have personally reviewed and evaluated these images and lab results as part of my medical decision-making.   EKG Interpretation None      MDM   Becky Vargas is a 44 y.o. female with a past medical history significant for hypertension, SVT, and irritable bowel syndrome who presents with 1 week of chills, abdominal pain, diarrhea, nausea, and mild rectal bleeding. The patient reports that her symptoms are similar to when she has had GI problems in the past. Given the small amount of blood with her bowel movements, concern for possible inflammatory bowel disease as well as her irritable bowel syndrome which she knows about. Given the patient's significant abdominal pain, the patient had a diagnostic workup including imaging and laboratory testing.  The patient had a negative pregnancy test, her urinalysis did not show evidence of acute infection. As the patient was recently on her period and she was complaining of some rectal bleeding, hematuria likely secondary to contamination. The patient's lipase was normal, her electrolytes were unremarkable, and her CBC did not show evidence of a significant leukocytosis anemia with thrombocytopenia.   The patient's CT of the abdomen showed evidence of duodenitis but otherwise no evidence of diffuse inflammatory bowel disease. The patient reported her pain had improved following morphine administration and her nausea improved following Zofran. The patient was given a liter normal saline given possible subjective dehydration from her symptoms.   After the patient was reassured about her findings, the patient was felt appropriate safe for discharge. The patient was started on Cipro and Flagyl for her enteritis and the patient was instructed to follow-up with her PCP for further management. The patient was given nausea medicine and pain medicine at discharge. The patient was given instructions to return to the emergency department for  any signs or symptoms of worsening infection.   The patient denied any other questions, concerns, or complaints and agreed with the plan of discharge and follow-up. The patient was discharged in good condition.  This patient was seen with Dr. Verdie Mosher, emergency medicine attending.   Final diagnoses:  Enteritis          Theda Belfast, MD 03/06/15 4782  Lavera Guise, MD 03/07/15 [phone]

## 2015-03-06 NOTE — ED Notes (Addendum)
Pt here for IBS flare up. sts that she hasn't had a flare up in years. sts that everything she eats goes through her. sts using BRAT diet. sts blood on tissue. sts severe diarrhea

## 2015-05-11 ENCOUNTER — Inpatient Hospital Stay (HOSPITAL_COMMUNITY): Payer: Medicaid Other

## 2015-05-11 ENCOUNTER — Encounter (HOSPITAL_COMMUNITY): Payer: Self-pay | Admitting: *Deleted

## 2015-05-11 ENCOUNTER — Inpatient Hospital Stay (HOSPITAL_COMMUNITY)
Admission: AD | Admit: 2015-05-11 | Discharge: 2015-05-11 | Disposition: A | Discharge status: Home / Self Care | Payer: Self-pay | Source: Ambulatory Visit | Attending: Obstetrics & Gynecology | Admitting: Obstetrics & Gynecology

## 2015-05-11 DIAGNOSIS — M545 Low back pain: Secondary | ICD-10-CM | POA: Insufficient documentation

## 2015-05-11 DIAGNOSIS — I10 Essential (primary) hypertension: Secondary | ICD-10-CM | POA: Insufficient documentation

## 2015-05-11 DIAGNOSIS — M5431 Sciatica, right side: Secondary | ICD-10-CM | POA: Insufficient documentation

## 2015-05-11 DIAGNOSIS — M5441 Lumbago with sciatica, right side: Secondary | ICD-10-CM

## 2015-05-11 HISTORY — DX: Noninfective gastroenteritis and colitis, unspecified: K52.9

## 2015-05-11 LAB — COMPREHENSIVE METABOLIC PANEL
ALK PHOS: 58 U/L (ref 38–126)
ALT: 12 U/L — AB (ref 14–54)
AST: 22 U/L (ref 15–41)
Albumin: 4.2 g/dL (ref 3.5–5.0)
Anion gap: 5 (ref 5–15)
BUN: 12 mg/dL (ref 6–20)
CHLORIDE: 106 mmol/L (ref 101–111)
CO2: 26 mmol/L (ref 22–32)
CREATININE: 0.74 mg/dL (ref 0.44–1.00)
Calcium: 8.4 mg/dL — ABNORMAL LOW (ref 8.9–10.3)
GFR calc Af Amer: >60 mL/min (ref >60)
Glucose, Bld: 87 mg/dL (ref 65–99)
Potassium: 3.7 mmol/L (ref 3.5–5.1)
Sodium: 137 mmol/L (ref 135–145)
Total Bilirubin: 0.8 mg/dL (ref 0.3–1.2)
Total Protein: 7.4 g/dL (ref 6.5–8.1)

## 2015-05-11 LAB — URINALYSIS, ROUTINE W REFLEX MICROSCOPIC
BILIRUBIN URINE: NEGATIVE
Glucose, UA: NEGATIVE mg/dL
KETONES UR: NEGATIVE mg/dL
Leukocytes, UA: NEGATIVE
NITRITE: NEGATIVE
PH: 6.5 (ref 5.0–8.0)
PROTEIN: NEGATIVE mg/dL
Specific Gravity, Urine: 1.02 (ref 1.005–1.030)

## 2015-05-11 LAB — CBC
HCT: 35.2 % — ABNORMAL LOW (ref 36.0–46.0)
Hemoglobin: 12.7 g/dL (ref 12.0–15.0)
MCH: 34.7 pg — AB (ref 26.0–34.0)
MCHC: 36.1 g/dL — ABNORMAL HIGH (ref 30.0–36.0)
MCV: 96.2 fL (ref 78.0–100.0)
PLATELETS: 218 10*3/uL (ref 150–400)
RBC: 3.66 MIL/uL — ABNORMAL LOW (ref 3.87–5.11)
RDW: 13.7 % (ref 11.5–15.5)
WBC: 5 10*3/uL (ref 4.0–10.5)

## 2015-05-11 LAB — WET PREP, GENITAL
SPERM: NONE SEEN
TRICH WET PREP: NONE SEEN
Yeast Wet Prep HPF POC: NONE SEEN

## 2015-05-11 LAB — URINE MICROSCOPIC-ADD ON: WBC, UA: NONE SEEN WBC/hpf (ref 0–5)

## 2015-05-11 LAB — POCT PREGNANCY, URINE: Preg Test, Ur: NEGATIVE

## 2015-05-11 MED ORDER — KETOROLAC TROMETHAMINE 60 MG/2ML IM SOLN
60.0000 mg | Freq: Once | INTRAMUSCULAR | Status: AC
  Administered 2015-05-11: 60 mg via INTRAMUSCULAR
  Filled 2015-05-11: qty 2

## 2015-05-11 NOTE — MAU Note (Signed)
Pt presents to MAU with complaints of lower back pain and pelvic pain for two weeks. Denies any vaginal bleeding or abnormal discharge

## 2015-05-11 NOTE — MAU Note (Signed)
C/o intermittent back pain since "before Thanksgiving"; pain has progressively gotten worse; denies injury;
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
## 2015-05-11 NOTE — Discharge Instructions (Signed)

## 2015-05-12 LAB — GC/CHLAMYDIA PROBE AMP (~~LOC~~) NOT AT ARMC
CHLAMYDIA, DNA PROBE: NEGATIVE
NEISSERIA GONORRHEA: NEGATIVE

## 2016-09-09 ENCOUNTER — Encounter (HOSPITAL_COMMUNITY): Payer: Self-pay | Admitting: Emergency Medicine

## 2016-09-09 ENCOUNTER — Emergency Department (HOSPITAL_COMMUNITY): Payer: Self-pay

## 2016-09-09 ENCOUNTER — Emergency Department (HOSPITAL_COMMUNITY)
Admission: EM | Admit: 2016-09-09 | Discharge: 2016-09-09 | Disposition: A | Discharge status: Home / Self Care | Payer: Self-pay | Attending: Emergency Medicine | Admitting: Emergency Medicine

## 2016-09-09 DIAGNOSIS — I10 Essential (primary) hypertension: Secondary | ICD-10-CM | POA: Insufficient documentation

## 2016-09-09 DIAGNOSIS — R072 Precordial pain: Secondary | ICD-10-CM | POA: Insufficient documentation

## 2016-09-09 DIAGNOSIS — F17210 Nicotine dependence, cigarettes, uncomplicated: Secondary | ICD-10-CM | POA: Insufficient documentation

## 2016-09-09 LAB — CBC WITH DIFFERENTIAL/PLATELET
BASOS PCT: 0 %
Basophils Absolute: 0 10*3/uL (ref 0.0–0.1)
EOS PCT: 1 %
Eosinophils Absolute: 0 10*3/uL (ref 0.0–0.7)
HCT: 33 % — ABNORMAL LOW (ref 36.0–46.0)
HEMOGLOBIN: 11.7 g/dL — AB (ref 12.0–15.0)
Lymphocytes Relative: 45 %
Lymphs Abs: 2 10*3/uL (ref 0.7–4.0)
MCH: 33.7 pg (ref 26.0–34.0)
MCHC: 35.5 g/dL (ref 30.0–36.0)
MCV: 95.1 fL (ref 78.0–100.0)
MONOS PCT: 7 %
Monocytes Absolute: 0.3 10*3/uL (ref 0.1–1.0)
NEUTROS PCT: 47 %
Neutro Abs: 2.1 10*3/uL (ref 1.7–7.7)
PLATELETS: 244 10*3/uL (ref 150–400)
RBC: 3.47 MIL/uL — AB (ref 3.87–5.11)
RDW: 13.2 % (ref 11.5–15.5)
WBC: 4.5 10*3/uL (ref 4.0–10.5)

## 2016-09-09 LAB — BASIC METABOLIC PANEL
ANION GAP: 9 (ref 5–15)
BUN: 8 mg/dL (ref 6–20)
CALCIUM: 8.3 mg/dL — AB (ref 8.9–10.3)
CHLORIDE: 104 mmol/L (ref 101–111)
CO2: 25 mmol/L (ref 22–32)
CREATININE: 0.79 mg/dL (ref 0.44–1.00)
GFR calc Af Amer: >60 mL/min (ref >60)
GFR calc non Af Amer: >60 mL/min (ref >60)
GLUCOSE: 94 mg/dL (ref 65–99)
POTASSIUM: 3.9 mmol/L (ref 3.5–5.1)
Sodium: 138 mmol/L (ref 135–145)

## 2016-09-09 LAB — I-STAT TROPONIN, ED
TROPONIN I, POC: 0 ng/mL (ref 0.00–0.08)
Troponin i, poc: 0 ng/mL (ref 0.00–0.08)

## 2016-09-09 MED ORDER — ASPIRIN 81 MG PO CHEW
324.0000 mg | CHEWABLE_TABLET | Freq: Once | ORAL | Status: AC
  Administered 2016-09-09: 324 mg via ORAL
  Filled 2016-09-09: qty 4

## 2016-09-09 NOTE — ED Triage Notes (Signed)
Per EMS, pt from home. Pt was sitting at her computer when she started feeling a heaviness/sob in her chest. Then pt stated that she felt her heart racing then slow down back and forth. Pt started feeling anxious/tension in her chest in which she then proceeded to call EMS. Pt denies any chest pain. Pt reports she has been under a lot of stress, the past few weeks she mentioned her hand twitching. Pt states she drinks a glass of wine/limearita almost every day a week. Pt has a hx of SVT and HTN but is not prescribed meds. Cardiologist Algie Coffer, MD.

## 2016-09-09 NOTE — Discharge Instructions (Signed)

## 2016-09-09 NOTE — ED Notes (Signed)
ED Provider at bedside. 

## 2016-09-09 NOTE — ED Notes (Signed)
Nurse drawing labs. 

## 2016-09-09 NOTE — ED Notes (Signed)
Patient transported to X-ray 

## 2016-09-09 NOTE — ED Provider Notes (Signed)
Emergency Department Provider Note   I have reviewed the triage vital signs and the nursing notes.   HISTORY  Chief Complaint Shortness of Breath and Anxiety   HPI Becky Vargas is a 46 y.o. female with history of HTN and SVT who presents to the ED for evaluation of chest pain. This patient states that she was using a computer 2-3 hours ago when she had onset of chest heaviness and palpitations. At interview, she denies any chest pain. She does report occasional sensations of racing heart beat. No abdominal pain. No fevers, chills, or cough. She was admitted in 2008 and had a negative L heart cath. No family history of early cardiac death. No cocaine use or IV drug use. No modifying factors. No radiation of pain.    Past Medical History:  Diagnosis Date  . Colitis   . Endometriosis   . Hypertension   . Irritable bowel syndrome (IBS)   . SVT (supraventricular tachycardia) (HCC)     There are no active problems to display for this patient.   Past Surgical History:  Procedure Laterality Date  . COLONOSCOPY    . LAPAROSCOPY      Current Outpatient Rx  . Order #: [id number] Class: Historical Med    Allergies Sulfa antibiotics  Family History  Problem Relation Age of Onset  . Diabetes Mother   . Diabetes Father   . Hypertension Sister   . Hypertension Brother     Social History Social History  Substance Use Topics  . Smoking status: Current Every Day Smoker    Packs/day: 1.00    Types: Cigarettes  . Smokeless tobacco: Never Used  . Alcohol use 3.0 oz/week    5 Glasses of wine per week    Review of Systems Constitutional: No fever/chills Eyes: No visual changes. ENT: No sore throat. Cardiovascular: + chest pain, palpitations Respiratory: Denies shortness of breath. Gastrointestinal: No abdominal pain.  No nausea, no vomiting.  No diarrhea.  No constipation. Genitourinary: Negative for dysuria. Musculoskeletal: Negative for back pain. Skin: Negative  for rash. Neurological: Negative for headaches, focal weakness or numbness.  10-point ROS otherwise negative.  ____________________________________________   PHYSICAL EXAM:  VITAL SIGNS: ED Triage Vitals  Enc Vitals Group     BP 09/09/16 0217 (!) 180/102     Pulse Rate 09/09/16 0217 66     Resp 09/09/16 0217 16     Temp 09/09/16 0217 97.4 F (36.3 C)     Temp Source 09/09/16 0217 Oral     SpO2 09/09/16 0209 99 %     Weight 09/09/16 0219 105 lb (47.6 kg)     Height 09/09/16 0219  (1.499 m)     Pain Score 09/09/16 0217 8   Constitutional: Alert and oriented. Well appearing and in no acute distress. Eyes: Conjunctivae are normal. Head: Atraumatic. Nose: No congestion/rhinnorhea. Mouth/Throat: Mucous membranes are moist.  Oropharynx non-erythematous. Neck: No stridor.  Cardiovascular: Normal rate, regular rhythm. Good peripheral circulation. Grossly normal heart sounds.   Respiratory: Normal respiratory effort.  No retractions. Lungs CTAB. Gastrointestinal: Soft and nontender. No distention.  Musculoskeletal: No lower extremity tenderness nor edema. No gross deformities of extremities. Neurologic:  Normal speech and language. No gross focal neurologic deficits are appreciated.  Skin:  Skin is warm, dry and intact. No rash noted. Psychiatric: Mood and affect are normal. Speech and behavior are normal.  ____________________________________________   LABS (all labs ordered are listed, but only abnormal results are displayed)  Labs  Reviewed  BASIC METABOLIC PANEL - Abnormal; Notable for the following:       Result Value   Calcium 8.3 (*)    All other components within normal limits  CBC WITH DIFFERENTIAL/PLATELET - Abnormal; Notable for the following:    RBC 3.47 (*)    Hemoglobin 11.7 (*)    HCT 33.0 (*)    All other components within normal limits  I-STAT TROPOININ, ED  I-STAT TROPOININ, ED   ____________________________________________  EKG   EKG  Interpretation  Date/Time:  Monday September 09 2016 02:42:13 EDT Ventricular Rate:  58 PR Interval:    QRS Duration: 85 QT Interval:  433 QTC Calculation: 426 R Axis:   48 Text Interpretation:  Sinus rhythm Ventricular premature complex Probable anteroseptal infarct, old No STEMI.  Confirmed by Angelito Hopping MD, Razan Siler [phone]) on 09/09/2016 2:46:48 AM Also confirmed by Sumie Remsen MD, Ravin Bendall [phone]), editor [place]  CCT, [place] ([id number])  on 09/09/2016 6:56:25 AM       ____________________________________________  RADIOLOGY  Dg Chest 2 View  Result Date: 09/09/2016 CLINICAL DATA:  Chest tightness and dyspnea tonight. EXAM: CHEST  2 VIEW COMPARISON:  Five hundred twenty-five oblique FINDINGS: The lungs are clear. The pulmonary vasculature is normal. Heart size is normal. Hilar and mediastinal contours are unremarkable. There is no pleural effusion. IMPRESSION: No active cardiopulmonary disease. Electronically Signed   By: Ellery Plunk M.D.   On: 09/09/2016 03:18    ____________________________________________   PROCEDURES  Procedure(s) performed:   Procedures  None ____________________________________________   INITIAL IMPRESSION / ASSESSMENT AND PLAN / ED COURSE  Pertinent labs & imaging results that were available during my care of the patient were reviewed by me and considered in my medical decision making (see chart for details).  Patient presents to the ED with chest tightness and mild dyspnea. No acute distress or hypoxemia. Reports some stress immediately prior to symptom onset. Normal EKG. HEART score 3. PERC negative. O2 for comfort only. No acute distress or hypoxemia.   Differential includes all life-threatening causes for chest pain. This includes but is not exclusive to acute coronary syndrome, aortic dissection, pulmonary embolism, cardiac tamponade, community-acquired pneumonia, pericarditis, musculoskeletal chest wall pain, etc.  07:31 AM Repeat troponin negative. Plan for  discharge with PCP/Cardiology follow up.   At this time, I do not feel there is any life-threatening condition present. I have reviewed and discussed all results (EKG, imaging, lab, urine as appropriate), exam findings with patient. I have reviewed nursing notes and appropriate previous records.  I feel the patient is safe to be discharged home without further emergent workup. Discussed usual and customary return precautions. Patient and family (if present) verbalize understanding and are comfortable with this plan.  Patient will follow-up with their primary care provider. If they do not have a primary care provider, information for follow-up has been provided to them. All questions have been answered.  ____________________________________________  FINAL CLINICAL IMPRESSION(S) / ED DIAGNOSES  Final diagnoses:  Precordial chest pain     MEDICATIONS GIVEN DURING THIS VISIT:  Medications  aspirin chewable tablet 324 mg (324 mg Oral Given 09/09/16 0413)     NEW OUTPATIENT MEDICATIONS STARTED DURING THIS VISIT:  None   Note:  This document was prepared using Dragon voice recognition software and may include unintentional dictation errors.  Alona Bene, MD Emergency Medicine   Maia Plan, MD 09/09/16 [phone]

## 2016-10-29 ENCOUNTER — Encounter (HOSPITAL_COMMUNITY): Payer: Self-pay

## 2016-10-29 ENCOUNTER — Emergency Department (HOSPITAL_COMMUNITY): Payer: Self-pay

## 2016-10-29 ENCOUNTER — Emergency Department (HOSPITAL_COMMUNITY)
Admission: EM | Admit: 2016-10-29 | Discharge: 2016-10-29 | Disposition: A | Discharge status: Home / Self Care | Payer: Self-pay | Attending: Emergency Medicine | Admitting: Emergency Medicine

## 2016-10-29 DIAGNOSIS — W230XXA Caught, crushed, jammed, or pinched between moving objects, initial encounter: Secondary | ICD-10-CM | POA: Insufficient documentation

## 2016-10-29 DIAGNOSIS — Y999 Unspecified external cause status: Secondary | ICD-10-CM | POA: Insufficient documentation

## 2016-10-29 DIAGNOSIS — I10 Essential (primary) hypertension: Secondary | ICD-10-CM | POA: Insufficient documentation

## 2016-10-29 DIAGNOSIS — S6792XA Crushing injury of unspecified part(s) of left wrist, hand and fingers, initial encounter: Secondary | ICD-10-CM | POA: Insufficient documentation

## 2016-10-29 DIAGNOSIS — S6992XA Unspecified injury of left wrist, hand and finger(s), initial encounter: Secondary | ICD-10-CM

## 2016-10-29 DIAGNOSIS — Y92810 Car as the place of occurrence of the external cause: Secondary | ICD-10-CM | POA: Insufficient documentation

## 2016-10-29 DIAGNOSIS — F17210 Nicotine dependence, cigarettes, uncomplicated: Secondary | ICD-10-CM | POA: Insufficient documentation

## 2016-10-29 DIAGNOSIS — Y9389 Activity, other specified: Secondary | ICD-10-CM | POA: Insufficient documentation

## 2016-10-29 NOTE — ED Provider Notes (Signed)
[facility]-EMERGENCY DEPT Provider Note   CSN: [id number] Arrival date & time: 10/29/16  1622  By signing my name below, I, Phillips ClimesFabiola de Louis, attest that this documentation has been prepared under the direction and in the presence of Felicie Mornavid Caryle Helgeson, NP.  Electronically Signed: Phillips ClimesFabiola de Louis, Scribe. 10/29/2016. 5:36 PM.  History   Chief Complaint Chief Complaint  Patient presents with  . Finger Injury   HPI Comments Becky Vargas is a 46 y.o. female with a PMHx significant for HTN and SVT, who presents to the Emergency Department with complaints of a throbbing left ring finger pain s/p jamming her finger in a car door x2 days ago. Pain is rated a 6/10 in severity and worse with movement. Pt denies experiencing any other acute sx, including nausea, vomiting, abdominal pain, fever or chills. Pt has not attempted to treat her sx with any OTC medications. She has been noncompliant with her BP medications today, but reports that they are at her home. No chest pain, SOB, headache or light-headedness.   The history is provided by the patient and medical records. No language interpreter was used.    Past Medical History:  Diagnosis Date  . Colitis   . Endometriosis   . Hypertension   . Irritable bowel syndrome (IBS)   . SVT (supraventricular tachycardia) (HCC)     There are no active problems to display for this patient.   Past Surgical History:  Procedure Laterality Date  . COLONOSCOPY    . LAPAROSCOPY      OB History    Gravida Para Term Preterm AB Living   2 2 2  0 0 2   SAB TAB Ectopic Multiple Live Births   0 0 0 0         Home Medications    Prior to Admission medications   Medication Sig Start Date End Date Taking? Authorizing Provider  naproxen sodium (ALEVE) 220 MG tablet Take 220 mg by mouth 2 (two) times daily as needed (for pain).    [provider]    Family History Family History  Problem Relation Age of Onset  . Diabetes Mother   . Diabetes Father    . Hypertension Sister   . Hypertension Brother     Social History Social History  Substance Use Topics  . Smoking status: Current Every Day Smoker    Packs/day: 1.00    Types: Cigarettes  . Smokeless tobacco: Never Used  . Alcohol use 3.0 oz/week    5 Glasses of wine per week     Allergies   Sulfa antibiotics   Review of Systems Review of Systems  Constitutional: Negative for chills and fever.  Respiratory: Negative for shortness of breath.   Cardiovascular: Negative for chest pain.  Gastrointestinal: Negative for abdominal pain, nausea and vomiting.  Musculoskeletal: Positive for arthralgias.  Neurological: Negative for dizziness, light-headedness and headaches.  All other systems reviewed and are negative.  Physical Exam Updated Vital Signs BP (!) 201/111   Pulse 100   Temp 98.4 F (36.9 C) (Oral)   Resp 16   LMP 09/25/2016   SpO2 99%   Physical Exam  Constitutional: She is oriented to person, place, and time. She appears well-developed and well-nourished. No distress.  HENT:  Head: Normocephalic and atraumatic.  Eyes: Conjunctivae are normal.  Neck: Neck supple.  Cardiovascular: Normal rate.   Pulmonary/Chest: Effort normal.  Abdominal: Soft.  Musculoskeletal: Normal range of motion. She exhibits no deformity.  Tenderness at the DIP  joint of the left ring finger. Sensation and motor intact. No obvious strength deficits or deformity.  Neurological: She is alert and oriented to person, place, and time.  Skin: Skin is warm and dry.  Psychiatric: She has a normal mood and affect.  Nursing note and vitals reviewed.  ED Treatments / Results  DIAGNOSTIC STUDIES: Oxygen Saturation is 99% on RA, normal by my interpretation.    COORDINATION OF CARE: 5:23 PM Discussed treatment plan with pt at bedside and pt agreed to plan. She has a PCP and will f/u with him. She would not like to take BP medications in the ED, and will take her prescribed home regimen when she  is d/c.  Labs (all labs ordered are listed, but only abnormal results are displayed) Labs Reviewed - No data to display  EKG  EKG Interpretation None       Radiology Dg Finger Ring Left  Result Date: 10/29/2016 CLINICAL DATA:  Injury with pain to the DIP and PIP joints EXAM: LEFT RING FINGER 2+V COMPARISON:  None. FINDINGS: There is no evidence of fracture or dislocation. There is no evidence of arthropathy or other focal bone abnormality. Soft tissues are unremarkable. IMPRESSION: Negative. Electronically Signed   By: Jasmine Pang M.D.   On: 10/29/2016 17:10    Procedures .Splint Application Date/Time: 10/29/2016 5:51 PM Performed by: Katrinka Blazing, Estell Dillinger Authorized by: Katrinka Blazing, Lavina Resor   Consent:    Consent obtained:  Verbal   Consent given by:  Patient   Risks discussed:  Pain and swelling   Alternatives discussed:  No treatment Pre-procedure details:    Sensation:  Normal   Skin color:  Pink Procedure details:    Laterality:  Left   Location:  Finger   Finger:  L ring finger   Splint type:  Finger   Supplies:  Aluminum splint Post-procedure details:    Pain:  Improved   Sensation:  Normal   Patient tolerance of procedure:  Tolerated well, no immediate complications Comments:     Splint applied by orthopedic technician.   (including critical care time)  Medications Ordered in ED Medications - No data to display   Initial Impression / Assessment and Plan / ED Course  I have reviewed the triage vital signs and the nursing notes.  Pertinent labs & imaging results that were available during my care of the patient were reviewed by me and considered in my medical decision making (see chart for details).  Patient X-Ray negative for obvious fracture or dislocation.  Pt advised to follow up with PCP and orthopedics. Patient given splint while in ED, conservative therapy recommended and discussed. Patient will be discharged home & is agreeable with above plan. Returns  precautions discussed. Pt appears safe for discharge.       Final Clinical Impressions(s) / ED Diagnoses   Final diagnoses:  Injury of finger of left hand, initial encounter    New Prescriptions New Prescriptions   No medications on file   I personally performed the services described in this documentation, which was scribed in my presence. The recorded information has been reviewed and is accurate.    Felicie Morn, NP 10/29/16 1753    Jacalyn Lefevre, MD 10/29/16 1816

## 2016-10-29 NOTE — ED Notes (Signed)
Pt reports she did not take her BP meds today.  

## 2016-10-29 NOTE — Progress Notes (Signed)
Orthopedic Tech Progress Note Patient Details:  Irene LimboShuronda R Bielak 1970-10-31 [id number]  Ortho Devices Type of Ortho Device: Finger splint Ortho Device/Splint Location: LUE Ortho Device/Splint Interventions: Ordered, Application   Jennye MoccasinHughes, Erienne Spelman Craig 10/29/2016, 5:47 PM

## 2016-10-29 NOTE — ED Triage Notes (Signed)
Pt reports she jammed her left ring finger yesterday morning at 4am

## 2016-12-26 ENCOUNTER — Encounter (HOSPITAL_COMMUNITY): Payer: Self-pay | Admitting: Emergency Medicine

## 2016-12-26 ENCOUNTER — Emergency Department (HOSPITAL_COMMUNITY)
Admission: EM | Admit: 2016-12-26 | Discharge: 2016-12-27 | Disposition: A | Discharge status: Home / Self Care | Payer: Self-pay | Attending: Emergency Medicine | Admitting: Emergency Medicine

## 2016-12-26 ENCOUNTER — Emergency Department (HOSPITAL_COMMUNITY): Payer: Self-pay

## 2016-12-26 DIAGNOSIS — F17210 Nicotine dependence, cigarettes, uncomplicated: Secondary | ICD-10-CM | POA: Insufficient documentation

## 2016-12-26 DIAGNOSIS — R1011 Right upper quadrant pain: Secondary | ICD-10-CM | POA: Insufficient documentation

## 2016-12-26 DIAGNOSIS — I10 Essential (primary) hypertension: Secondary | ICD-10-CM | POA: Insufficient documentation

## 2016-12-26 DIAGNOSIS — K589 Irritable bowel syndrome without diarrhea: Secondary | ICD-10-CM | POA: Insufficient documentation

## 2016-12-26 DIAGNOSIS — R109 Unspecified abdominal pain: Secondary | ICD-10-CM

## 2016-12-26 LAB — PREGNANCY, URINE: PREG TEST UR: NEGATIVE

## 2016-12-26 LAB — URINALYSIS, ROUTINE W REFLEX MICROSCOPIC
BACTERIA UA: NONE SEEN
BILIRUBIN URINE: NEGATIVE
Glucose, UA: NEGATIVE mg/dL
KETONES UR: 5 mg/dL — AB
LEUKOCYTES UA: NEGATIVE
Nitrite: NEGATIVE
PROTEIN: NEGATIVE mg/dL
Specific Gravity, Urine: 1.024 (ref 1.005–1.030)
pH: 6 (ref 5.0–8.0)

## 2016-12-26 LAB — COMPREHENSIVE METABOLIC PANEL
ALBUMIN: 4 g/dL (ref 3.5–5.0)
ALT: 13 U/L — ABNORMAL LOW (ref 14–54)
ANION GAP: 8 (ref 5–15)
AST: 22 U/L (ref 15–41)
Alkaline Phosphatase: 54 U/L (ref 38–126)
BUN: 9 mg/dL (ref 6–20)
CALCIUM: 7.9 mg/dL — AB (ref 8.9–10.3)
CO2: 24 mmol/L (ref 22–32)
Chloride: 105 mmol/L (ref 101–111)
Creatinine, Ser: 0.76 mg/dL (ref 0.44–1.00)
GFR calc non Af Amer: >60 mL/min (ref >60)
GLUCOSE: 93 mg/dL (ref 65–99)
POTASSIUM: 3.3 mmol/L — AB (ref 3.5–5.1)
Sodium: 137 mmol/L (ref 135–145)
Total Bilirubin: 0.3 mg/dL (ref 0.3–1.2)
Total Protein: 6.6 g/dL (ref 6.5–8.1)

## 2016-12-26 LAB — CBC
HEMATOCRIT: 36.8 % (ref 36.0–46.0)
HEMOGLOBIN: 13.5 g/dL (ref 12.0–15.0)
MCH: 34.9 pg — AB (ref 26.0–34.0)
MCHC: 36.7 g/dL — ABNORMAL HIGH (ref 30.0–36.0)
MCV: 95.1 fL (ref 78.0–100.0)
Platelets: 251 10*3/uL (ref 150–400)
RBC: 3.87 MIL/uL (ref 3.87–5.11)
RDW: 13.1 % (ref 11.5–15.5)
WBC: 5.9 10*3/uL (ref 4.0–10.5)

## 2016-12-26 LAB — LIPASE, BLOOD: Lipase: 20 U/L (ref 11–51)

## 2016-12-26 MED ORDER — FENTANYL CITRATE (PF) 100 MCG/2ML IJ SOLN
100.0000 ug | Freq: Once | INTRAMUSCULAR | Status: AC
  Administered 2016-12-26: 100 ug via INTRAVENOUS
  Filled 2016-12-26: qty 2

## 2016-12-26 MED ORDER — IOPAMIDOL (ISOVUE-300) INJECTION 61%
100.0000 mL | Freq: Once | INTRAVENOUS | Status: AC | PRN
  Administered 2016-12-26: 80 mL via INTRAVENOUS

## 2016-12-26 MED ORDER — SODIUM CHLORIDE 0.9 % IV BOLUS (SEPSIS)
1000.0000 mL | Freq: Once | INTRAVENOUS | Status: AC
  Administered 2016-12-26: 1000 mL via INTRAVENOUS

## 2016-12-26 MED ORDER — IOPAMIDOL (ISOVUE-300) INJECTION 61%
INTRAVENOUS | Status: AC
  Filled 2016-12-26: qty 100

## 2016-12-26 MED ORDER — ONDANSETRON HCL 4 MG/2ML IJ SOLN
4.0000 mg | Freq: Once | INTRAMUSCULAR | Status: AC
  Administered 2016-12-26: 4 mg via INTRAVENOUS
  Filled 2016-12-26: qty 2

## 2016-12-26 NOTE — ED Notes (Signed)
Pt had drawn in triage Gold  Lavender Lt green Dark green 

## 2016-12-26 NOTE — ED Provider Notes (Signed)
[facility]-EMERGENCY DEPT Provider Note   CSN: [id number] Arrival date & time: 12/26/16  1553     History   Chief Complaint Chief Complaint  Patient presents with  . Abdominal Pain  . abdominal distention    HPI Becky Vargas is a 46 y.o. female.  HPI Patient presents abdominal pain. Has severe abdominal pain the upper abdomen that started today. Worse with movements. Has had some constipation. Has had some change in bowel movements over the last 3 weeks. The pain however just started today. No fevers. Decreased appetite. Previous history of irritable bowel syndrome. Sometimes has soft stools sometimes are hard stools. Has had a decreased appetite. States the pain gets worse after eating. Sometimes improves after bowel movement. States she's never had pain this severe in the past. Previous diagnostic laparoscopic surgery. Past Medical History:  Diagnosis Date  . Colitis   . Endometriosis   . Hypertension   . Irritable bowel syndrome (IBS)   . SVT (supraventricular tachycardia) (HCC)     There are no active problems to display for this patient.   Past Surgical History:  Procedure Laterality Date  . COLONOSCOPY    . LAPAROSCOPY      OB History    Gravida Para Term Preterm AB Living   2 2 2  0 0 2   SAB TAB Ectopic Multiple Live Births   0 0 0 0         Home Medications    Prior to Admission medications   Medication Sig Start Date End Date Taking? Authorizing Provider  acetaminophen (TYLENOL) 325 MG tablet Take 650 mg by mouth every 6 (six) hours as needed for headache.   Yes [provider]  naproxen sodium (ALEVE) 220 MG tablet Take 220 mg by mouth 2 (two) times daily as needed (for pain).   Yes [provider]  dicyclomine (BENTYL) 20 MG tablet Take 1 tablet (20 mg total) by mouth 3 (three) times daily as needed for spasms. 12/27/16   Benjiman Core, MD    Family History Family History  Problem Relation Age of Onset  . Diabetes Mother   .  Diabetes Father   . Hypertension Sister   . Hypertension Brother     Social History Social History  Substance Use Topics  . Smoking status: Current Every Day Smoker    Packs/day: 1.00    Types: Cigarettes  . Smokeless tobacco: Never Used  . Alcohol use 3.0 oz/week    5 Glasses of wine per week     Allergies   Sulfa antibiotics   Review of Systems Review of Systems  Constitutional: Positive for appetite change.  HENT: Negative for congestion, ear discharge and mouth sores.   Respiratory: Negative for shortness of breath.   Cardiovascular: Negative for chest pain.  Gastrointestinal: Positive for abdominal pain and nausea.  Genitourinary: Negative for dysuria and flank pain.  Musculoskeletal: Negative for back pain.  Neurological: Negative for tremors and numbness.  Psychiatric/Behavioral: Negative for confusion.     Physical Exam Updated Vital Signs BP (!) 194/109 (BP Location: Left Arm)   Pulse 71   Temp 98.2 F (36.8 C) (Oral)   Resp 18   Ht 4\' 11"  (1.499 m)   Wt 47.6 kg (105 lb)   LMP 11/17/2016 Comment: Upreg neg 12/26/16  SpO2 100%   BMI 21.21 kg/m   Physical Exam  Constitutional: She appears well-developed.  Patient appears uncomfortable  HENT:  Head: Normocephalic.  Eyes: Pupils are equal, round, and  reactive to light.  Neck: Neck supple.  Cardiovascular: Normal rate.   Pulmonary/Chest: Breath sounds normal.  Abdominal: There is tenderness.  Some distention. More upper abdominal to right upper quadrant tenderness. No hernias palpated.  Musculoskeletal: She exhibits no edema.  Neurological: She is alert.  Skin: Skin is warm. Capillary refill takes less than 2 seconds.     ED Treatments / Results  Labs (all labs ordered are listed, but only abnormal results are displayed) Labs Reviewed  CBC - Abnormal; Notable for the following:       Result Value   MCH 34.9 (*)    MCHC 36.7 (*)    All other components within normal limits  URINALYSIS,  ROUTINE W REFLEX MICROSCOPIC - Abnormal; Notable for the following:    Hgb urine dipstick MODERATE (*)    Ketones, ur 5 (*)    Squamous Epithelial / LPF 0-5 (*)    All other components within normal limits  COMPREHENSIVE METABOLIC PANEL - Abnormal; Notable for the following:    Potassium 3.3 (*)    Calcium 7.9 (*)    ALT 13 (*)    All other components within normal limits  LIPASE, BLOOD  PREGNANCY, URINE    EKG  EKG Interpretation None       Radiology Ct Abdomen Pelvis W Contrast  Result Date: 12/27/2016 CLINICAL DATA:  Initial valuation for acute severe upper abdominal pain. EXAM: CT ABDOMEN AND PELVIS WITH CONTRAST TECHNIQUE: Multidetector CT imaging of the abdomen and pelvis was performed using the standard protocol following bolus administration of intravenous contrast. CONTRAST:  <See Chart> ISOVUE-300 IOPAMIDOL (ISOVUE-300) INJECTION 61% COMPARISON:  Prior CT from 03/06/2015. FINDINGS: Lower chest: Mild subsegmental atelectatic changes present within the visualized lung bases. Visualized lung bases are otherwise clear. Hepatobiliary: The scattered subcentimeter hypodensities noted within liver, too small the characterize, but of doubtful clinical significance. Liver otherwise unremarkable. Gallbladder within normal limits. No biliary dilatation. Pancreas: Pancreas within normal limits. Spleen: Spleen within normal limits. Adrenals/Urinary Tract: Adrenal glands are normal. Kidneys equal in size with symmetric enhancement. No nephrolithiasis, hydronephrosis, or focal enhancing renal mass. No hydroureter. Bladder within normal limits. Stomach/Bowel: Stomach within normal limits. No evidence for bowel obstruction. Appendix within normal limits. No abnormal wall thickening, mucosal enhancement, or inflammatory fat stranding seen about the bowels. Vascular/Lymphatic: Normal intravascular enhancement seen throughout the intra-abdominal aorta and its branch vessels. Mild aortic atherosclerosis.  No aneurysm. No adenopathy. Reproductive: Uterus and left ovary are unremarkable. 3 cm right adnexal cyst, likely a normal physiologic ovarian cyst. Other: No free air or fluid. Musculoskeletal: No acute osseus abnormality. No worrisome lytic or blastic osseous lesions. Transitional lumbosacral anatomy with sacralization of the L5 vertebral body on the right noted. IMPRESSION: 1. No CT evidence for acute intra-abdominal or pelvic process. 2. 3 cm right adnexal cysts, most consistent with a normal physiologic ovarian cyst. Electronically Signed   By: Rise MuBenjamin  McClintock M.D.   On: 12/27/2016 00:10    Procedures Procedures (including critical care time)  Medications Ordered in ED Medications  fentaNYL (SUBLIMAZE) injection 100 mcg (100 mcg Intravenous Given 12/26/16 1957)  sodium chloride 0.9 % bolus 1,000 mL (0 mLs Intravenous Stopped 12/26/16 2220)  ondansetron (ZOFRAN) injection 4 mg (4 mg Intravenous Given 12/26/16 1957)  iopamidol (ISOVUE-300) 61 % injection 100 mL (80 mLs Intravenous Contrast Given 12/26/16 2343)     Initial Impression / Assessment and Plan / ED Course  I have reviewed the triage vital signs and the nursing notes.  Pertinent labs & imaging results that were available during my care of the patient were reviewed by me and considered in my medical decision making (see chart for details).   patient with abdominal pain. Tenderness improved somewhat on reexamination. States she's passed a lot of gas. History of irritable bowel. CT scan done and reassuring. Will discharge home. Hypertension also needs to be followed as an outpatient.  Final Clinical Impressions(s) / ED Diagnoses   Final diagnoses:  Abdominal pain, unspecified abdominal location  Hypertension, unspecified type    New Prescriptions New Prescriptions   DICYCLOMINE (BENTYL) 20 MG TABLET    Take 1 tablet (20 mg total) by mouth 3 (three) times daily as needed for spasms.     Benjiman Core, MD 12/27/16  [phone]

## 2016-12-26 NOTE — ED Notes (Signed)
MD just made me aware of a recollect on labs.  Me and the nurse was unaware.  I collect them and sent them down.

## 2016-12-26 NOTE — ED Notes (Signed)
Lab called ED secretary, lt green hemolyzed.

## 2016-12-26 NOTE — ED Triage Notes (Signed)
Patient states that she has IBS and normally has BM after eating but the past 3 days patient reports that she hasnt had BM until the following day. Patient reports hard BM this morning and severe abd pains. Patient states that distention has been going on for few weeks and waiting to get set up with specialist.

## 2016-12-27 MED ORDER — DICYCLOMINE HCL 20 MG PO TABS: 20.0000 mg | ORAL_TABLET | Freq: Three times a day (TID) | ORAL | 0 refills | Status: DC | PRN

## 2016-12-27 NOTE — Discharge Instructions (Signed)
Follow-up through Dr. for further management abdominal pain and hypertension.

## 2017-01-03 ENCOUNTER — Ambulatory Visit (INDEPENDENT_AMBULATORY_CARE_PROVIDER_SITE_OTHER): Payer: Self-pay | Admitting: Physician Assistant

## 2017-01-03 ENCOUNTER — Encounter (INDEPENDENT_AMBULATORY_CARE_PROVIDER_SITE_OTHER): Payer: Self-pay | Admitting: Physician Assistant

## 2017-01-03 VITALS — BP 183/112 | HR 79 | Temp 98.1°F | Ht 59.45 in | Wt 106.6 lb

## 2017-01-03 DIAGNOSIS — F418 Other specified anxiety disorders: Secondary | ICD-10-CM

## 2017-01-03 DIAGNOSIS — K582 Mixed irritable bowel syndrome: Secondary | ICD-10-CM

## 2017-01-03 DIAGNOSIS — I10 Essential (primary) hypertension: Secondary | ICD-10-CM

## 2017-01-03 DIAGNOSIS — G4700 Insomnia, unspecified: Secondary | ICD-10-CM

## 2017-01-03 MED ORDER — CLONAZEPAM 0.5 MG PO TABS
0.5000 mg | ORAL_TABLET | Freq: Every day | ORAL | 0 refills | Status: DC
Start: 2017-01-03 — End: 2021-11-19

## 2017-01-03 MED ORDER — DICYCLOMINE HCL 20 MG PO TABS: 20.0000 mg | ORAL_TABLET | Freq: Three times a day (TID) | ORAL | 1 refills | Status: DC | PRN

## 2017-01-03 MED ORDER — AMLODIPINE BESYLATE 10 MG PO TABS: 10.0000 mg | ORAL_TABLET | Freq: Every day | ORAL | 1 refills | Status: DC

## 2017-01-03 MED ORDER — ESCITALOPRAM OXALATE 10 MG PO TABS: 10.0000 mg | ORAL_TABLET | Freq: Every day | ORAL | 2 refills | Status: AC

## 2017-01-03 MED ORDER — HYDROCHLOROTHIAZIDE 25 MG PO TABS: 25.0000 mg | ORAL_TABLET | Freq: Every day | ORAL | 1 refills | Status: DC

## 2017-01-03 NOTE — Patient Instructions (Addendum)
Irritable Bowel Syndrome, Adult Irritable bowel syndrome (IBS) is not one specific disease. It is a group of symptoms that affects the organs responsible for digestion (gastrointestinal or GI tract). To regulate how your GI tract works, your body sends signals back and forth between your intestines and your brain. If you have IBS, there may be a problem with these signals. As a result, your GI tract does not function normally. Your intestines may become more sensitive and overreact to certain things. This is especially true when you eat certain foods or when you are under stress. There are four types of IBS. These may be determined based on the consistency of your stool:  IBS with diarrhea.  IBS with constipation.  Mixed IBS.  Unsubtyped IBS.  It is important to know which type of IBS you have. Some treatments are more likely to be helpful for certain types of IBS. What are the causes? The exact cause of IBS is not known. What increases the risk? You may have a higher risk of IBS if:  You are a woman.  You are younger than 48 years old.  You have a family history of IBS.  You have mental health problems.  You have had bacterial infection of your GI tract.  What are the signs or symptoms? Symptoms of IBS vary from person to person. The main symptom is abdominal pain or discomfort. Additional symptoms usually include one or more of the following:  Diarrhea, constipation, or both.  Abdominal swelling or bloating.  Feeling full or sick after eating a small or regular-size meal.  Frequent gas.  Mucus in the stool.  A feeling of having more stool left after a bowel movement.  Symptoms tend to come and go. They may be associated with stress, psychiatric conditions, or nothing at all. How is this diagnosed? There is no specific test to diagnose IBS. Your health care provider will make a diagnosis based on a physical exam, medical history, and your symptoms. You may have other  tests to rule out other conditions that may be causing your symptoms. These may include:  Blood tests.  X-rays.  CT scan.  Endoscopy and colonoscopy. This is a test in which your GI tract is viewed with a long, thin, flexible tube.  How is this treated? There is no cure for IBS, but treatment can help relieve symptoms. IBS treatment often includes:  Changes to your diet, such as: ? Eating more fiber. ? Avoiding foods that cause symptoms. ? Drinking more water. ? Eating regular, medium-sized portioned meals.  Medicines. These may include: ? Fiber supplements if you have constipation. ? Medicine to control diarrhea (antidiarrheal medicines). ? Medicine to help control muscle spasms in your GI tract (antispasmodic medicines). ? Medicines to help with any mental health issues, such as antidepressants or tranquilizers.  Therapy. ? Talk therapy may help with anxiety, depression, or other mental health issues that can make IBS symptoms worse.  Stress reduction. ? Managing your stress can help keep symptoms under control.  Follow these instructions at home:  Take medicines only as directed by your health care provider.  Eat a healthy diet. ? Avoid foods and drinks with added sugar. ? Include more whole grains, fruits, and vegetables gradually into your diet. This may be especially helpful if you have IBS with constipation. ? Avoid any foods and drinks that make your symptoms worse. These may include dairy products and caffeinated or carbonated drinks. ? Do not eat large meals. ? Drink enough   fluid to keep your urine clear or pale yellow.  Exercise regularly. Ask your health care provider for recommendations of good activities for you.  Keep all follow-up visits as directed by your health care provider. This is important. Contact a health care provider if:  You have constant pain.  You have trouble or pain with swallowing.  You have worsening diarrhea. Get help right away  if:  You have severe and worsening abdominal pain.  You have diarrhea and: ? You have a rash, stiff neck, or severe headache. ? You are irritable, sleepy, or difficult to awaken. ? You are weak, dizzy, or extremely thirsty.  You have bright red blood in your stool or you have black tarry stools.  You have unusual abdominal swelling that is painful.  You vomit continuously.  You vomit blood (hematemesis).  You have both abdominal pain and a fever. This information is not intended to replace advice given to you by your health care provider. Make sure you discuss any questions you have with your health care provider. Document Released: 05/27/2005 Document Revised: 10/27/2015 Document Reviewed: 02/11/2014 Elsevier Interactive Patient Education  2018 [employer] Diet for Irritable Bowel Syndrome When you have irritable bowel syndrome (IBS), the foods you eat and your eating habits are very important. IBS may cause various symptoms, such as abdominal pain, constipation, or diarrhea. Choosing the right foods can help ease discomfort caused by these symptoms. Work with your health care provider and dietitian to find the best eating plan to help control your symptoms. What general guidelines do I need to follow?  Keep a food diary. This will help you identify foods that cause symptoms. Write down: ? What you eat and when. ? What symptoms you have. ? When symptoms occur in relation to your meals.  Avoid foods that cause symptoms. Talk with your dietitian about other ways to get the same nutrients that are in these foods.  Eat more foods that contain fiber. Take a fiber supplement if directed by your dietitian.  Eat your meals slowly, in a relaxed setting.  Aim to eat 5-6 small meals per day. Do not skip meals.  Drink enough fluids to keep your urine clear or pale yellow.  Ask your health care provider if you should take an over-the-counter probiotic during flare-ups to help restore  healthy gut bacteria.  If you have cramping or diarrhea, try making your meals low in fat and high in carbohydrates. Examples of carbohydrates are pasta, rice, whole grain breads and cereals, fruits, and vegetables.  If dairy products cause your symptoms to flare up, try eating less of them. You might be able to handle yogurt better than other dairy products because it contains bacteria that help with digestion. What foods are not recommended? The following are some foods and drinks that may worsen your symptoms:  Fatty foods, such as French fries.  Milk products, such as cheese or ice cream.  Chocolate.  Alcohol.  Products with caffeine, such as coffee.  Carbonated drinks, such as soda.  The items listed above may not be a complete list of foods and beverages to avoid. Contact your dietitian for more information. What foods are good sources of fiber? Your health care provider or dietitian may recommend that you eat more foods that contain fiber. Fiber can help reduce constipation and other IBS symptoms. Add foods with fiber to your diet a little at a time so that your body can get used to them. Too much fiber at once   might cause gas and swelling of your abdomen. The following are some foods that are good sources of fiber:  Apples.  Peaches.  Pears.  Berries.  Figs.  Broccoli (raw).  Cabbage.  Carrots.  Raw peas.  Kidney beans.  Lima beans.  Whole grain bread.  Whole grain cereal.  Where to find more information: International Foundation for Functional Gastrointestinal Disorders: www.iffgd.org National Institute of Diabetes and Digestive and Kidney Diseases: www.niddk.nih.gov/health-information/health-topics/digestive-diseases/ibs/Pages/facts.aspx This information is not intended to replace advice given to you by your health care provider. Make sure you discuss any questions you have with your health care provider. Document Released: 08/17/2003 Document Revised:  11/02/2015 Document Reviewed: 08/27/2013 Elsevier Interactive Patient Education  2018 Elsevier Inc.  

## 2017-01-03 NOTE — Progress Notes (Signed)
Subjective:  Patient ID: Becky Vargas, female    DOB: 04/11/1971  Age: 46 y.o. MRN: [id number]  CC: abdominal pain  HPI Becky Vargas is a 46 y.o. female with a PMH of HTN, IBS, endometriosis, and SVT presents as a new patient for ED f/u. ED visit on 12/26/16. Had CT abdomen and pelvis done with no findings besides 3 cm right adnexal cyst. Diagnosed with colitis and prescribed Bentyl. Feels much better today. However, there is still occasional abdominal bloating, abdominal pain, fecal urgency, pain relieved upon defecation, mucus in stool, constipation, diarrhea, and frequent gas. Had colonoscopy to confirm IBS.     Also complains of depression and anxiety. [place], is going to school, and has several other stressors. Denies suicidality.    Outpatient Medications Prior to Visit  Medication Sig Dispense Refill  . dicyclomine (BENTYL) 20 MG tablet Take 1 tablet (20 mg total) by mouth 3 (three) times daily as needed for spasms. 10 tablet 0  . acetaminophen (TYLENOL) 325 MG tablet Take 650 mg by mouth every 6 (six) hours as needed for headache.    . naproxen sodium (ALEVE) 220 MG tablet Take 220 mg by mouth 2 (two) times daily as needed (for pain).     No facility-administered medications prior to visit.      ROS Review of Systems  Constitutional: Negative for chills, fever and malaise/fatigue.  Eyes: Negative for blurred vision.  Respiratory: Negative for shortness of breath.   Cardiovascular: Negative for chest pain and palpitations.  Gastrointestinal: Positive for abdominal pain, constipation and diarrhea. Negative for nausea.  Genitourinary: Negative for dysuria and hematuria.  Musculoskeletal: Negative for joint pain and myalgias.  Skin: Negative for rash.  Neurological: Negative for tingling and headaches.  Psychiatric/Behavioral: Negative for depression. The patient is not nervous/anxious.     Objective:  BP (!) 183/112 (BP Location: Left Arm, Patient  Position: Sitting, Cuff Size: Normal)   Pulse 79   Temp 98.1 F (36.7 C) (Oral)   Ht 4' 11.45" (1.51 m)   Wt 106 lb 9.6 oz (48.4 kg)   LMP 11/13/2016 (Approximate)   SpO2 99%   BMI 21.21 kg/m   BP/Weight 01/03/2017 12/27/2016 12/26/2016  Systolic BP 183 194 -  Diastolic BP 112 109 -  Wt. (Lbs) 106.6 - 105  BMI 21.21 - 21.21      Physical Exam  Constitutional: She is oriented to person, place, and time.  Well developed, well nourished, NAD, polite  HENT:  Head: Normocephalic and atraumatic.  Eyes: No scleral icterus.  Neck: Normal range of motion. Neck supple. No thyromegaly present.  Cardiovascular: Normal rate, regular rhythm and normal heart sounds.   Pulmonary/Chest: Effort normal and breath sounds normal.  Abdominal: Soft. Bowel sounds are normal. There is no tenderness.  Musculoskeletal: She exhibits no edema.  Neurological: She is alert and oriented to person, place, and time.  Skin: Skin is warm and dry. No rash noted. No erythema. No pallor.  Psychiatric: She has a normal mood and affect. Her behavior is normal. Thought content normal.  Vitals reviewed.    Assessment & Plan:    1. Irritable bowel syndrome with both constipation and diarrhea - dicyclomine (BENTYL) 20 MG tablet; Take 1 tablet (20 mg total) by mouth 3 (three) times daily as needed for spasms.  Dispense: 90 tablet; Refill: 1  2. Insomnia, unspecified type - escitalopram (LEXAPRO) 10 MG tablet; Take 1 tablet (10 mg total) by mouth daily.  Dispense: 30 tablet; Refill: 2 - clonazePAM (KLONOPIN) 0.5 MG tablet; Take 1 tablet (0.5 mg total) by mouth at bedtime.  Dispense: 21 tablet; Refill: 0  3. Depression with anxiety - escitalopram (LEXAPRO) 10 MG tablet; Take 1 tablet (10 mg total) by mouth daily.  Dispense: 30 tablet; Refill: 2 - clonazePAM (KLONOPIN) 0.5 MG tablet; Take 1 tablet (0.5 mg total) by mouth at bedtime.  Dispense: 21 tablet; Refill: 0  4. HTN, unspecified - Begin HCTZ 25 mg - Begin  Norvasc 10 mg  Meds ordered this encounter  Medications  . escitalopram (LEXAPRO) 10 MG tablet    Sig: Take 1 tablet (10 mg total) by mouth daily.    Dispense:  30 tablet    Refill:  2    Order Specific Question:   Supervising Provider    Answer:   Quentin AngstJEGEDE, OLUGBEMIGA E [id number]  . clonazePAM (KLONOPIN) 0.5 MG tablet    Sig: Take 1 tablet (0.5 mg total) by mouth at bedtime.    Dispense:  21 tablet    Refill:  0    Order Specific Question:   Supervising Provider    Answer:   Quentin AngstJEGEDE, OLUGBEMIGA E [id number]  . dicyclomine (BENTYL) 20 MG tablet    Sig: Take 1 tablet (20 mg total) by mouth 3 (three) times daily as needed for spasms.    Dispense:  90 tablet    Refill:  1    Order Specific Question:   Supervising Provider    Answer:   Quentin AngstJEGEDE, OLUGBEMIGA E [[id number]]    Follow-up: 4 weeks for full physical  Loletta Specteroger David Gomez PA

## 2017-11-14 ENCOUNTER — Emergency Department (HOSPITAL_COMMUNITY)
Admission: EM | Admit: 2017-11-14 | Discharge: 2017-11-14 | Disposition: A | Discharge status: Home / Self Care | Payer: Self-pay | Attending: Emergency Medicine | Admitting: Emergency Medicine

## 2017-11-14 ENCOUNTER — Encounter (HOSPITAL_COMMUNITY): Payer: Self-pay

## 2017-11-14 DIAGNOSIS — F17210 Nicotine dependence, cigarettes, uncomplicated: Secondary | ICD-10-CM | POA: Insufficient documentation

## 2017-11-14 DIAGNOSIS — Z79899 Other long term (current) drug therapy: Secondary | ICD-10-CM | POA: Insufficient documentation

## 2017-11-14 DIAGNOSIS — J029 Acute pharyngitis, unspecified: Secondary | ICD-10-CM | POA: Insufficient documentation

## 2017-11-14 DIAGNOSIS — I10 Essential (primary) hypertension: Secondary | ICD-10-CM | POA: Insufficient documentation

## 2017-11-14 DIAGNOSIS — F1210 Cannabis abuse, uncomplicated: Secondary | ICD-10-CM | POA: Insufficient documentation

## 2017-11-14 LAB — GROUP A STREP BY PCR: GROUP A STREP BY PCR: NOT DETECTED

## 2017-11-14 MED ORDER — AMLODIPINE BESYLATE 10 MG PO TABS: 10.0000 mg | ORAL_TABLET | Freq: Every day | ORAL | 0 refills | Status: DC

## 2017-11-14 MED ORDER — PHENOL 1.4 % MT LIQD
1.0000 | OROMUCOSAL | Status: DC | PRN
Start: 2017-11-14 — End: 2017-11-14
  Administered 2017-11-14: 1 via OROMUCOSAL
  Filled 2017-11-14: qty 177

## 2017-11-14 MED ORDER — HYDROCHLOROTHIAZIDE 25 MG PO TABS: 25.0000 mg | ORAL_TABLET | Freq: Every day | ORAL | 0 refills | Status: DC

## 2017-11-14 MED ORDER — ACETAMINOPHEN 325 MG PO TABS
650.0000 mg | ORAL_TABLET | Freq: Once | ORAL | Status: AC | PRN
  Administered 2017-11-14: 650 mg via ORAL
  Filled 2017-11-14: qty 2

## 2017-11-14 NOTE — ED Provider Notes (Signed)
[facility]-EMERGENCY DEPT Provider Note   CSN: [id number] Arrival date & time: 11/14/17  0905   History   Chief Complaint Chief Complaint  Patient presents with  . Sore Throat    HPI Becky Vargas is a 47 y.o. female with a past medical history of HTN, SVT, irritable bowel syndrome who presented to the ED with complaints of sore throat.   She reports a 5-day history of throat pain.  Pain is constant and worsens with swallowing.  She has looked at her throat in the mirror and has noted some white patches to the back of her throat.  She has tried various remedies at home including drinking hot tea, throat lozenges, Sudafed, chewing and orange peel without relief.  She took Tylenol yesterday which did help the pain for a period.  She denies cough, fever, nasal congestion or rhinorrhea. Does feel as though she has increased saliva. Sx have been interfering with sleep. No sick contacts prior to symptom onset, notes being around a girl who was coughing 2 days ago.  Her blood pressure is elevated and she states she only takes her blood pressure medication occasionally when she sees dark spots because she knows her blood pressure is elevated at that time and she needs it.  Past Medical History:  Diagnosis Date  . Colitis   . Endometriosis   . Hypertension   . Irritable bowel syndrome (IBS)   . SVT (supraventricular tachycardia) (HCC)     There are no active problems to display for this patient.   Past Surgical History:  Procedure Laterality Date  . COLONOSCOPY    . LAPAROSCOPY       OB History    Gravida  2   Para  2   Term  2   Preterm  0   AB  0   Living  2     SAB  0   TAB  0   Ectopic  0   Multiple  0   Live Births               Home Medications    Prior to Admission medications   Medication Sig Start Date End Date Taking? Authorizing Provider  clonazePAM (KLONOPIN) 0.5 MG tablet Take 1 tablet (0.5 mg total) by mouth at  bedtime. 01/03/17  Yes Loletta SpecterGomez, Roger David, PA-C  dicyclomine (BENTYL) 20 MG tablet Take 1 tablet (20 mg total) by mouth 3 (three) times daily as needed for spasms. 01/03/17  Yes Loletta SpecterGomez, Roger David, PA-C  escitalopram (LEXAPRO) 10 MG tablet Take 1 tablet (10 mg total) by mouth daily. 01/03/17  Yes Loletta SpecterGomez, Roger David, PA-C  amLODipine (NORVASC) 10 MG tablet Take 1 tablet (10 mg total) by mouth daily. 11/14/17   Ginger CarneHarden, Sajad Glander, MD  hydrochlorothiazide (HYDRODIURIL) 25 MG tablet Take 1 tablet (25 mg total) by mouth daily. Take on tablet in the morning. 11/14/17   Ginger CarneHarden, Shanyn Preisler, MD    Family History Family History  Problem Relation Age of Onset  . Diabetes Mother   . Diabetes Father   . Hypertension Sister   . Hypertension Brother     Social History Social History   Tobacco Use  . Smoking status: Current Every Day Smoker    Packs/day: 0.35    Types: Cigarettes  . Smokeless tobacco: Never Used  Substance Use Topics  . Alcohol use: Yes    Alcohol/week: 3.0 oz    Types: 5 Glasses of wine per week  .  Drug use: Not Currently    Types: Marijuana    Comment: occasionally      Allergies   Sulfa antibiotics   Review of Systems Review of Systems  Constitutional: Negative for fever.  HENT: Positive for sore throat and voice change. Negative for rhinorrhea.   Eyes: Negative for visual disturbance.  Respiratory: Negative for wheezing and stridor.   Cardiovascular: Negative for chest pain.  Gastrointestinal: Negative for abdominal pain.  Skin: Negative for rash.  Hematological: Negative for adenopathy.     Physical Exam Updated Vital Signs BP (!) 182/125   Pulse 100   Temp 98.7 F (37.1 C) (Oral)   Resp 18   Ht 4\' 11"  (1.499 m)   Wt 47.6 kg (105 lb)   SpO2 97%   BMI 21.21 kg/m   General: Resting on ED stretcher comfortably, no acute distress Head: Normocephalic, atraumatic  Eyes: Normal conjuctiva, EOMI, PERRL  ENT: Tonsillar and uvular enlargement with some exudate, no  vesicular lesions noted. Uvula midline, and tonsils symmetric. No cervical lymphadenopathy. Bilateral TMs normal without erythema or bulging  CV: RRR, no murmur appreciated  Resp: Clear breath sounds bilaterally, normal work of breathing, no distress  Abd: Soft, +BS, non-tender to palpation  Extr: No LE edema, good peripheral pulses  Neuro: Alert and oriented x3, moving all extremities spontaneously, no gross deficit  Skin: Warm, dry      ED Treatments / Results  Labs (all labs ordered are listed, but only abnormal results are displayed) Labs Reviewed  GROUP A STREP BY PCR  CULTURE, GROUP A STREP [facility])    EKG None  Radiology No results found.  Procedures Procedures (including critical care time)  Medications Ordered in ED Medications  phenol (CHLORASEPTIC) mouth spray 1 spray (1 spray Mouth/Throat Given 11/14/17 1050)  acetaminophen (TYLENOL) tablet 650 mg (650 mg Oral Given 11/14/17 1050)     Initial Impression / Assessment and Plan / ED Course  I have reviewed the triage vital signs and the nursing notes.  Pertinent labs & imaging results that were available during my care of the patient were reviewed by me and considered in my medical decision making (see chart for details).  47 year old female presenting with 5 day history of sore throat with some tonsillar erythema, enlargement, and exudate. She is older than typical for strep cases, afebrile but will test while in the ED and treat if positive. Her presentation may also be due to viral cause such as ebv. Will recommend continued symptomatic treatment and provide expectant management. Rapid strep negative, feel this is more likely viral and will continue symptomatic treatment.   Pt also has issue with medication adherence and demonstrates poor understanding of need for regular use of anti-hypertensives for improved blood pressure control to prevent long term complications. Also cited financial difficulty. She is currently  asymptomatic. Pt counseled on this issue, encouraged to follow up with PCP (established about a year ago). Will provide refills of anti-hypertensives, both of which available on $4 list.   Final Clinical Impressions(s) / ED Diagnoses   Final diagnoses:  Pharyngitis, unspecified etiology    ED Discharge Orders        Ordered    amLODipine (NORVASC) 10 MG tablet  Daily     11/14/17 1213    hydrochlorothiazide (HYDRODIURIL) 25 MG tablet  Daily     11/14/17 1213       Ginger Carne, MD 11/14/17 1252    Lorre Nick, MD 11/15/17 1145

## 2017-11-14 NOTE — Discharge Instructions (Addendum)
Nice to meet you Ms. Ragle.  The test for strep throat was negative. This makes it more likely that your symptoms are due to a viral infection which can also cause this pain and the white patches on the back of your throat. It should slowly start to get better and go away over the next several days. We recommend continuing to take tylenol (up to 1000 mg every 6-8 hours) and ibuprofen (600 mg every 8 hours) to help with the pain and irritation. Throat lozenges that help to numb the area are also helpful. For your blood pressure, it is very important to take these medicines every day. Even if you do not feel like your blood pressure is elevated, these medicines are needed each day to keep your pressure in a safe range. We provided refills for these medicines and they are on the $4 list at [facility].

## 2017-11-14 NOTE — ED Triage Notes (Addendum)
Patient c/o sore throat x 5 days. (redness and a few white spots noted)  Patient was hypertensive in triage. Patient states she did not take her BP med this morning.

## 2017-11-14 NOTE — ED Provider Notes (Signed)
I saw and evaluated the patient, reviewed the resident's note and I agree with the findings and plan.  EKG: None 47 year old female presents with sore throat x5 days.  On exam throat is erythematous mildly without deviation of the patient's uvula.  There is no positive sick exposures.  Will check rapid strep.   Lorre NickAllen, Rolland Steinert, MD 11/14/17 1050

## 2018-02-25 ENCOUNTER — Emergency Department (HOSPITAL_COMMUNITY): Payer: Self-pay

## 2018-02-25 ENCOUNTER — Encounter (HOSPITAL_COMMUNITY): Payer: Self-pay | Admitting: *Deleted

## 2018-02-25 ENCOUNTER — Other Ambulatory Visit: Payer: Self-pay

## 2018-02-25 ENCOUNTER — Emergency Department (HOSPITAL_COMMUNITY)
Admission: EM | Admit: 2018-02-25 | Discharge: 2018-02-25 | Disposition: A | Discharge status: Home / Self Care | Payer: Self-pay | Attending: Emergency Medicine | Admitting: Emergency Medicine

## 2018-02-25 DIAGNOSIS — N12 Tubulo-interstitial nephritis, not specified as acute or chronic: Secondary | ICD-10-CM | POA: Insufficient documentation

## 2018-02-25 DIAGNOSIS — Z79899 Other long term (current) drug therapy: Secondary | ICD-10-CM | POA: Insufficient documentation

## 2018-02-25 DIAGNOSIS — I10 Essential (primary) hypertension: Secondary | ICD-10-CM | POA: Insufficient documentation

## 2018-02-25 DIAGNOSIS — R102 Pelvic and perineal pain: Secondary | ICD-10-CM | POA: Insufficient documentation

## 2018-02-25 DIAGNOSIS — R300 Dysuria: Secondary | ICD-10-CM | POA: Insufficient documentation

## 2018-02-25 DIAGNOSIS — F17210 Nicotine dependence, cigarettes, uncomplicated: Secondary | ICD-10-CM | POA: Insufficient documentation

## 2018-02-25 LAB — URINALYSIS, ROUTINE W REFLEX MICROSCOPIC
BILIRUBIN URINE: NEGATIVE
GLUCOSE, UA: NEGATIVE mg/dL
KETONES UR: NEGATIVE mg/dL
Nitrite: NEGATIVE
PROTEIN: 30 mg/dL — AB
Specific Gravity, Urine: 1.012 (ref 1.005–1.030)
pH: 6 (ref 5.0–8.0)

## 2018-02-25 LAB — CBC WITH DIFFERENTIAL/PLATELET
BASOS ABS: 0 10*3/uL (ref 0.0–0.1)
Basophils Relative: 0 %
EOS ABS: 0 10*3/uL (ref 0.0–0.7)
Eosinophils Relative: 0 %
HCT: 39.2 % (ref 36.0–46.0)
HEMOGLOBIN: 13.7 g/dL (ref 12.0–15.0)
LYMPHS ABS: 1.8 10*3/uL (ref 0.7–4.0)
LYMPHS PCT: 20 %
MCH: 33.1 pg (ref 26.0–34.0)
MCHC: 34.9 g/dL (ref 30.0–36.0)
MCV: 94.7 fL (ref 78.0–100.0)
Monocytes Absolute: 0.5 10*3/uL (ref 0.1–1.0)
Monocytes Relative: 5 %
NEUTROS PCT: 75 %
Neutro Abs: 6.6 10*3/uL (ref 1.7–7.7)
Platelets: 306 10*3/uL (ref 150–400)
RBC: 4.14 MIL/uL (ref 3.87–5.11)
RDW: 12.6 % (ref 11.5–15.5)
WBC: 8.9 10*3/uL (ref 4.0–10.5)

## 2018-02-25 LAB — I-STAT BETA HCG BLOOD, ED (MC, WL, AP ONLY)

## 2018-02-25 LAB — COMPREHENSIVE METABOLIC PANEL
ALT: 13 U/L (ref 0–44)
AST: 23 U/L (ref 15–41)
Albumin: 4.5 g/dL (ref 3.5–5.0)
Alkaline Phosphatase: 69 U/L (ref 38–126)
Anion gap: 12 (ref 5–15)
BUN: 15 mg/dL (ref 6–20)
CHLORIDE: 102 mmol/L (ref 98–111)
CO2: 25 mmol/L (ref 22–32)
CREATININE: 0.85 mg/dL (ref 0.44–1.00)
Calcium: 9.2 mg/dL (ref 8.9–10.3)
GFR calc non Af Amer: >60 mL/min (ref >60)
Glucose, Bld: 96 mg/dL (ref 70–99)
POTASSIUM: 3.4 mmol/L — AB (ref 3.5–5.1)
Sodium: 139 mmol/L (ref 135–145)
TOTAL PROTEIN: 7.9 g/dL (ref 6.5–8.1)
Total Bilirubin: 1.2 mg/dL (ref 0.3–1.2)

## 2018-02-25 MED ORDER — HYDROCODONE-ACETAMINOPHEN 5-325 MG PO TABS: 1.0000 | ORAL_TABLET | ORAL | 0 refills | Status: DC | PRN

## 2018-02-25 MED ORDER — IOHEXOL 300 MG/ML  SOLN
75.0000 mL | Freq: Once | INTRAMUSCULAR | Status: AC | PRN
  Administered 2018-02-25: 75 mL via INTRAVENOUS

## 2018-02-25 MED ORDER — CEFTRIAXONE SODIUM 1 G IJ SOLR
1.0000 g | Freq: Once | INTRAMUSCULAR | Status: AC
  Administered 2018-02-25: 1 g via INTRAVENOUS
  Filled 2018-02-25: qty 10

## 2018-02-25 MED ORDER — IOHEXOL 300 MG/ML  SOLN
30.0000 mL | Freq: Once | INTRAMUSCULAR | Status: AC | PRN
  Administered 2018-02-25: 30 mL via ORAL

## 2018-02-25 MED ORDER — HYDROCODONE-ACETAMINOPHEN 5-325 MG PO TABS
1.0000 | ORAL_TABLET | Freq: Once | ORAL | Status: AC
  Administered 2018-02-25: 1 via ORAL
  Filled 2018-02-25: qty 1

## 2018-02-25 MED ORDER — CEPHALEXIN 500 MG PO CAPS: 500.0000 mg | ORAL_CAPSULE | Freq: Four times a day (QID) | ORAL | 0 refills | Status: AC

## 2018-02-25 MED ORDER — SODIUM CHLORIDE 0.9 % IV BOLUS
1000.0000 mL | Freq: Once | INTRAVENOUS | Status: AC
  Administered 2018-02-25: 1000 mL via INTRAVENOUS

## 2018-02-25 MED ORDER — KETOROLAC TROMETHAMINE 15 MG/ML IJ SOLN
15.0000 mg | Freq: Once | INTRAMUSCULAR | Status: AC
  Administered 2018-02-25: 15 mg via INTRAVENOUS
  Filled 2018-02-25: qty 1

## 2018-02-25 NOTE — Discharge Instructions (Addendum)
Please see the information and instructions below regarding your visit.  Your diagnoses today include:  1. Pyelonephritis    Pyelonephritis occurs when a urinary tract infection travels up the tubes and the urinary system to the bladder.  This needs a longer course of antibiotics than a typical urinary tract infection.  Tests performed today include: See side panel of your discharge paperwork for testing performed today. Vital signs are listed at the bottom of these instructions.   CT scan showed no other abnormalities other than some inflammation around the kidneys.  Medications prescribed:    Take any prescribed medications only as prescribed, and any over the counter medications only as directed on the packaging.  Please take all of your antibiotics until finished.   You may develop abdominal discomfort or nausea from the antibiotic. If this occurs, you may take it with food. Some patients also get diarrhea with antibiotics. You may help offset this with probiotics which you can buy or get in yogurt. Do not eat or take the probiotics until 2 hours after your antibiotic. Some women develop vaginal yeast infections after antibiotics. If you develop unusual vaginal discharge after being on this medication, please see your primary care provider.   Some people develop allergies to antibiotics. Symptoms of antibiotic allergy can be mild and include a flat rash and itching. They can also be more serious and include:  ?Hives - Hives are raised, red patches of skin that are usually very itchy.  ?Lip or tongue swelling  ?Trouble swallowing or breathing  ?Blistering of the skin or mouth.  If you have any of these serious symptoms, please seek emergency medical care immediately.  You have been prescribed Norco for pain. This is an opioid pain medication. You may take this medication every 4-6 hours as needed for pain. Only take this medication if you need it for breakthrough pain. You may  combine this medicine with ibuprofen, a non-steroidal anti-inflammatory drug (NSAID) every 6 hours, so you are getting something for pain relief every 3 hours.  Do not combine this medication with Tylenol, as it may increase the risk of liver problems.  Do not combine this medication with alcohol.  Please be advised to avoid driving or operating heavy machinery while taking this medication, as it may make you drowsy or impair judgment.    Home care instructions:  Please follow any educational materials contained in this packet.   Follow-up instructions: Please follow-up with your primary care provider as soon as possible for further evaluation of your symptoms if they are not completely improved.   To find a primary care or specialty doctor please call [phone] or [phone] to access "[place] Find a Doctor Service."  You may also go on the [facility] website at [email or web address]  There are also multiple [facility] and [facility] practices throughout the [place] that are frequently accepting new patients. You may find a clinic that is close to your home and contact them.  [facility] [address] [phone]  [facility] in [place] (also locations in [place]) - [address] [place] [phone]  [facility] - [address] [phone]   I placed a consult to case management regarding no primary care provider at this time.   Return instructions:  Please return to the Emergency Department if you experience worsening symptoms.  Please return to the emergency department if you develop any  worsening pain, fever, nausea vomiting prevention keep anything down. Please return if you have any other emergent concerns.  Additional Information:   Your vital signs today were: BP (!) 193/101 (BP Location:  Left Arm)    Pulse 74    Temp 98.5 F (36.9 C) (Oral)    Resp 18    Ht 4\' 11"  (1.499 m)    Wt 44.5 kg    SpO2 100%    BMI 19.79 kg/m  If your blood pressure (BP) was elevated on multiple readings during this visit above 130 for the top number or above 80 for the bottom number, please have this repeated by your primary care provider within one month. --------------  Thank you for allowing [place] to participate in your care today.

## 2018-02-25 NOTE — ED Triage Notes (Signed)
Patient arrives with c/o dysuria and frequency that staretd at 0200 this morning. Patient now c/o vaginal bleeding - LMP 12 months ago. Patient reports bilateral flank pain. - Fever.

## 2018-02-25 NOTE — ED Notes (Signed)
Pt is alert and oriented x 4, and verbally responsive. Pt reports 5/10 pain to bilateral flanks and area is tender with touch x 2 weeks with associated  pain and burning with urination. Pt reports that she also has some vaginal pain as well.

## 2018-02-25 NOTE — ED Provider Notes (Signed)
[facility]-EMERGENCY DEPT Provider Note   CSN: [id number] Arrival date & time: 02/25/18  1547     History   Chief Complaint Chief Complaint  Patient presents with  . Dysuria  . Urinary Frequency  . Vaginal Bleeding    HPI Becky Vargas is a 47 y.o. female.  HPI  Patient is a 47 year old female with a history of SVT, hypertension, presenting for bilateral flank pain, dysuria, and seeing blood when she wipes.  Patient reports that this began yesterday, but she is reporting insidious onset of her bilateral flank pain for over the past several days.  Patient has any fevers, chills, nausea, or vomiting.  Patient does report that she has pain that wraps around to the right lower abdomen.  Patient reports he has not been treated in 1 year, and thought that she was starting to have a period again.  Patient reports she has been sexually active with one female partner, unprotected.  Patient denies any increased vaginal discharge.  Patient does report that she felt flushed yesterday, but did not take her temperature, and took ibuprofen which helped with the symptoms.  Past Medical History:  Diagnosis Date  . Colitis   . Endometriosis   . Hypertension   . Irritable bowel syndrome (IBS)   . SVT (supraventricular tachycardia) (HCC)     There are no active problems to display for this patient.   Past Surgical History:  Procedure Laterality Date  . COLONOSCOPY    . LAPAROSCOPY       OB History    Gravida  2   Para  2   Term  2   Preterm  0   AB  0   Living  2     SAB  0   TAB  0   Ectopic  0   Multiple  0   Live Births               Home Medications    Prior to Admission medications   Medication Sig Start Date End Date Taking? Authorizing Provider  amLODipine (NORVASC) 10 MG tablet Take 1 tablet (10 mg total) by mouth daily. 11/14/17  Yes Ginger Carne, MD  hydrochlorothiazide (HYDRODIURIL) 25 MG tablet Take 1 tablet (25 mg total) by  mouth daily. Take on tablet in the morning. 11/14/17  Yes Ginger Carne, MD  clonazePAM (KLONOPIN) 0.5 MG tablet Take 1 tablet (0.5 mg total) by mouth at bedtime. Patient not taking: Reported on 02/25/2018 01/03/17   Loletta Specter, PA-C  dicyclomine (BENTYL) 20 MG tablet Take 1 tablet (20 mg total) by mouth 3 (three) times daily as needed for spasms. Patient not taking: Reported on 02/25/2018 01/03/17   Loletta Specter, PA-C  escitalopram (LEXAPRO) 10 MG tablet Take 1 tablet (10 mg total) by mouth daily. Patient not taking: Reported on 02/25/2018 01/03/17   Loletta Specter, PA-C    Family History Family History  Problem Relation Age of Onset  . Diabetes Mother   . Diabetes Father   . Hypertension Sister   . Hypertension Brother     Social History Social History   Tobacco Use  . Smoking status: Current Every Day Smoker    Packs/day: 0.35    Types: Cigarettes  . Smokeless tobacco: Never Used  Substance Use Topics  . Alcohol use: Yes    Alcohol/week: 5.0 standard drinks    Types: 5 Glasses of wine per week  . Drug use: Not Currently  Types: Marijuana    Comment: occasionally      Allergies   Sulfa antibiotics   Review of Systems Review of Systems  Constitutional: Negative for chills and fever.  HENT: Negative for congestion, rhinorrhea, sinus pain and sore throat.   Eyes: Negative for visual disturbance.  Respiratory: Negative for cough, chest tightness and shortness of breath.   Cardiovascular: Negative for chest pain, palpitations and leg swelling.  Gastrointestinal: Positive for abdominal pain. Negative for diarrhea, nausea and vomiting.  Genitourinary: Positive for dysuria, flank pain, frequency and hematuria.  Musculoskeletal: Negative for back pain and myalgias.  Skin: Negative for rash.  Neurological: Negative for dizziness, syncope, light-headedness and headaches.     Physical Exam Updated Vital Signs BP (!) 193/101 (BP Location: Left Arm)   Pulse  74   Temp 98.5 F (36.9 C) (Oral)   Resp 18   Ht 4\' 11"  (1.499 m)   Wt 44.5 kg   SpO2 100%   BMI 19.79 kg/m   Physical Exam  Constitutional: She appears well-developed and well-nourished. No distress.  HENT:  Head: Normocephalic and atraumatic.  Mouth/Throat: Oropharynx is clear and moist.  Eyes: Pupils are equal, round, and reactive to light. Conjunctivae and EOM are normal.  Neck: Normal range of motion. Neck supple.  Cardiovascular: Normal rate, regular rhythm, S1 normal and S2 normal.  No murmur heard. Pulmonary/Chest: Effort normal and breath sounds normal. She has no wheezes. She has no rales.  Abdominal: Soft. She exhibits no distension. There is tenderness. There is no guarding.  Tenderness palpation without guarding or rebound in the right lower abdomen to right lateral abdomen. Positive CVA tenderness on right.  Genitourinary:  Genitourinary Comments: Pelvic examination performed with RN chaperone present.  No external lesions of the vagina or perineum.  Patient does have horizontal chain lymphadenopathy.  Cervix is not erythematous and nonfriable, and there is minimal discharge surrounding cervix.  No vaginal bleeding.  Musculoskeletal: Normal range of motion. She exhibits no edema or deformity.  Lymphadenopathy:    She has no cervical adenopathy.  Neurological: She is alert.  Cranial nerves grossly intact. Patient moves extremities symmetrically and with good coordination.  Skin: Skin is warm and dry.  Patient has pruritic, small erythematous patch of left elbow and left cheek.  Psychiatric: She has a normal mood and affect. Her behavior is normal. Judgment and thought content normal.  Nursing note and vitals reviewed.    ED Treatments / Results  Labs (all labs ordered are listed, but only abnormal results are displayed) Labs Reviewed  URINALYSIS, ROUTINE W REFLEX MICROSCOPIC - Abnormal; Notable for the following components:      Result Value   APPearance HAZY  (*)    Hgb urine dipstick LARGE (*)    Protein, ur 30 (*)    Leukocytes, UA LARGE (*)    RBC / HPF >50 (*)    WBC, UA >50 (*)    Bacteria, UA RARE (*)    All other components within normal limits  COMPREHENSIVE METABOLIC PANEL - Abnormal; Notable for the following components:   Potassium 3.4 (*)    All other components within normal limits  URINE CULTURE  CBC WITH DIFFERENTIAL/PLATELET  I-STAT BETA HCG BLOOD, ED ([facility], [facility], [facility] ONLY)    EKG EKG Interpretation  Date/Time:  Wednesday February 25 2018 18:55:41 EDT Ventricular Rate:  58 PR Interval:    QRS Duration: 75 QT Interval:  406 QTC Calculation: 399 R Axis:   71 Text Interpretation:  Ectopic atrial rhythm Left ventricular hypertrophy Anterior infarct, old Nonspecific T abnormalities, inferior leads similar when compared to prior 09/09/16 Confirmed by Tilden Fossaees, Elizabeth [phone]([id number]) on 02/25/2018 7:08:33 PM   Radiology Ct Abdomen Pelvis W Contrast  Result Date: 02/25/2018 CLINICAL DATA:  Dysuria and urinary frequency starting at 0200 hours. Vaginal bleeding. Bilateral flank pain and fever. EXAM: CT ABDOMEN AND PELVIS WITH CONTRAST TECHNIQUE: Multidetector CT imaging of the abdomen and pelvis was performed using the standard protocol following bolus administration of intravenous contrast. CONTRAST:  75mL OMNIPAQUE IOHEXOL 300 MG/ML SOLN, 30mL OMNIPAQUE IOHEXOL 300 MG/ML SOLN COMPARISON:  12/26/2016 FINDINGS: Lower chest: Normal size heart without pericardial effusion. Clear lung bases. Hepatobiliary: Scattered subcentimeter rounded hypodensities statistically consistent with cysts or hemangiomata are again noted, too small to further characterize. No dominant mass or enhancing lesions. No biliary dilatation. Normal gallbladder. Pancreas: Normal Spleen: Normal in size without focal abnormality. Adrenals/Urinary Tract: Adrenal glands are unremarkable. Striated enhancement of the kidneys raise concern for subtle changes of pyelonephritis. No  obstructive uropathy. The urinary bladder is decompressed. Stomach/Bowel: Stomach is within normal limits. Appendix appears normal. No evidence of bowel wall thickening, distention, or inflammatory changes. Vascular/Lymphatic: No significant vascular findings are present. No enlarged abdominal or pelvic lymph nodes. Reproductive: Uterus and bilateral adnexa are unremarkable. Other: No abdominal wall hernia or abnormality. No abdominopelvic ascites. Musculoskeletal: Transitional lumbosacral anatomy with sacralized L5 vertebral body. No acute osseous abnormality or worrisome osseous lesions. IMPRESSION: Striated enhancement pattern of the kidneys compatible with pyelonephritis. No lobar nephronia. Electronically Signed   By: Tollie Ethavid  Kwon M.D.   On: 02/25/2018 20:49    Procedures Procedures (including critical care time)  Medications Ordered in ED Medications  cefTRIAXone (ROCEPHIN) 1 g in sodium chloride 0.9 % 100 mL IVPB (1 g Intravenous New Bag/Given 02/25/18 1846)  HYDROcodone-acetaminophen (NORCO/VICODIN) 5-325 MG per tablet 1 tablet (1 tablet Oral Given 02/25/18 1842)  sodium chloride 0.9 % bolus 1,000 mL (1,000 mLs Intravenous New Bag/Given 02/25/18 1848)  iohexol (OMNIPAQUE) 300 MG/ML solution 30 mL (30 mLs Oral Contrast Given 02/25/18 1834)  iohexol (OMNIPAQUE) 300 MG/ML solution 75 mL (75 mLs Intravenous Contrast Given 02/25/18 2031)     Initial Impression / Assessment and Plan / ED Course  I have reviewed the triage vital signs and the nursing notes.  Pertinent labs & imaging results that were available during my care of the patient were reviewed by me and considered in my medical decision making (see chart for details).  Clinical Course as of Feb 26 145  Thu Feb 26, 2018  0144 Elevated blood pressure noted today.  Patient did take antihypertensives today.  Patient is denying any chest pain, shortness of breath, headache, nausea, dizziness or lightheadedness.  Patient regularly follows with  Dr. Sharyn LullHarwani for her blood pressure.  BP(!): 193/102 [AM]    Clinical Course User Index [AM] Elisha PonderMurray, Jan Walters B, PA-C    Patient is nontoxic-appearing, afebrile, and in no acute distress.  Differential diagnosis includes pyelonephritis, appendicitis, nephrolithiasis with urinary tract infection, pelvic inflammatory disease.  Do not suspect PID, as patient is not having any vaginal discharge and minimal pelvic pain.  Patient symptoms are mostly dysuria, and flank pain.  Suspect that patient's bleeding is coming from urinary source and not vaginal, as pelvic exam was normal.   Work-up is remarkable for urine appearing infected with WBC clumps suggestive of pyelonephritis, normal renal function, no leukocytosis.  CT abdomen and pelvis demonstrates no evidence of appendicitis, nephrolithiasis, but does demonstrate subtle  changes of pyelonephritis with stranding around the kidneys.  Patient treated with Rocephin in emergency department, and prescribed Keflex outpatient.  Patient did note to me erythematous patch of elbow and left cheek that are pruritic, but preceded administration of Rocephin.  Do not suspect allergic reaction.  No evidence of systemic urticaria.  Patient has previously tolerated Rocephin per medical record.  Patient is a prescribed short course of Norco No. 6.  Patient instructed not to drive, drink alcohol, work, operate machinery while taking this medication.  Patient was given return precautions for any worsening pain, fever or chills, or intractable nausea or vomiting.  Patient is following up with her cardiologist, Dr. Sharyn Lull regarding blood pressure.  Patient is in understanding and agrees with the plan of care.  Final Clinical Impressions(s) / ED Diagnoses   Final diagnoses:  Pyelonephritis  Elevated blood pressure reading with diagnosis of hypertension    ED Discharge Orders         Ordered    cephALEXin (KEFLEX) 500 MG capsule  4 times daily     02/25/18 2157     HYDROcodone-acetaminophen (NORCO/VICODIN) 5-325 MG tablet  Every 4 hours PRN     02/25/18 2157           Elisha Ponder, PA-C 02/26/18 0150    Tilden Fossa, MD 02/26/18 1459

## 2018-02-26 NOTE — Care Management Note (Signed)
Case Management Note  CM consulted for no pcp and no ins with need for follow up.  CM noted pt was given the 3 clinics on AVS at time of transition home.  CM sent a message to [facility] CM for possible appointment cancellation openings to establish care.  Elnoria HowardUpdated Murray, PA via messages.  No further CM needs noted at this time.  Dandra Velardi, Lynnae SandhoffAngela N, RN 02/26/2018, 9:14 AM

## 2018-02-27 ENCOUNTER — Telehealth: Payer: Self-pay

## 2018-02-27 NOTE — Telephone Encounter (Signed)
Message received from Eldridge AbrahamsAngela Kritzer, RN CM requesting a [facility]. Informed her that there are currently no appointments available. The [facility], [facility] on her AVS and will need to call to check appointment availability.

## 2018-02-28 LAB — URINE CULTURE

## 2018-03-01 ENCOUNTER — Telehealth: Payer: Self-pay | Admitting: *Deleted

## 2018-03-01 NOTE — Telephone Encounter (Signed)
Post ED Visit - Positive Culture Follow-up  Culture report reviewed by antimicrobial stewardship pharmacist:  []  Enzo BiNathan Batchelder, Pharm.D. []  Celedonio MiyamotoJeremy Frens, Pharm.D., BCPS AQ-ID []  Garvin FilaMike Maccia, Pharm.D., BCPS []  Georgina PillionElizabeth Martin, Pharm.D., BCPS []  Archer LodgeMinh Pham, [address] BoulevardPharm.D., BCPS, AAHIVP []  Estella HuskMichelle Turner, Pharm.D., BCPS, AAHIVP [x]  Lysle Pearlachel Rumbarger, PharmD, BCPS []  Phillips Climeshuy Dang, PharmD, BCPS []  Agapito GamesAlison Masters, PharmD, BCPS []  Verlan FriendsErin Deja, PharmD  Positive urine culture Treated with Cephalexin, organism sensitive to the same and no further patient follow-up is required at this time.  Virl AxeRobertson, Inez [facility] 03/01/2018, 10:11 AM

## 2018-03-10 ENCOUNTER — Telehealth: Payer: Self-pay

## 2018-03-10 NOTE — Telephone Encounter (Signed)
Attempted to contact the patient to discuss scheduling a hospital follow up appointment at [facility] as there are appointments available this week. Call placed to # [phone] (H) and a message was left requesting a call back to # [phone]

## 2018-03-18 ENCOUNTER — Telehealth: Payer: Self-pay

## 2018-03-18 NOTE — Telephone Encounter (Signed)
Attempted to contact the patient to discuss scheduling a follow up appointment. Call placed to # [phone], the person who answered said that Hamdi was not around. This CM asked to please give her the message to call this CM back .  The person stated that the phone # would be on the phone.

## 2020-01-05 ENCOUNTER — Encounter (HOSPITAL_COMMUNITY): Payer: Self-pay

## 2020-01-05 ENCOUNTER — Emergency Department (HOSPITAL_COMMUNITY)
Admission: EM | Admit: 2020-01-05 | Discharge: 2020-01-05 | Disposition: A | Discharge status: Home / Self Care | Payer: Self-pay | Attending: Emergency Medicine | Admitting: Emergency Medicine

## 2020-01-05 ENCOUNTER — Other Ambulatory Visit: Payer: Self-pay

## 2020-01-05 DIAGNOSIS — Z5321 Procedure and treatment not carried out due to patient leaving prior to being seen by health care provider: Secondary | ICD-10-CM | POA: Insufficient documentation

## 2020-01-05 DIAGNOSIS — I10 Essential (primary) hypertension: Secondary | ICD-10-CM | POA: Insufficient documentation

## 2020-01-05 DIAGNOSIS — R6889 Other general symptoms and signs: Secondary | ICD-10-CM | POA: Insufficient documentation

## 2020-01-05 DIAGNOSIS — R0981 Nasal congestion: Secondary | ICD-10-CM | POA: Insufficient documentation

## 2020-01-05 DIAGNOSIS — R030 Elevated blood-pressure reading, without diagnosis of hypertension: Secondary | ICD-10-CM | POA: Insufficient documentation

## 2020-01-05 NOTE — ED Triage Notes (Addendum)
Patient went to health department and would not be seen due to high BP-208/121, 194/108  Pt reports she has not taken her BP medication in several years  C/o head pressure from possible sinus infection  Pt states she is congested.

## 2020-01-06 ENCOUNTER — Other Ambulatory Visit: Payer: Self-pay

## 2020-01-06 ENCOUNTER — Encounter (HOSPITAL_COMMUNITY): Payer: Self-pay

## 2020-01-06 ENCOUNTER — Emergency Department (HOSPITAL_COMMUNITY)
Admission: EM | Admit: 2020-01-06 | Discharge: 2020-01-06 | Disposition: A | Discharge status: Home / Self Care | Payer: Medicaid Other | Attending: Emergency Medicine | Admitting: Emergency Medicine

## 2020-01-06 DIAGNOSIS — F17210 Nicotine dependence, cigarettes, uncomplicated: Secondary | ICD-10-CM | POA: Insufficient documentation

## 2020-01-06 DIAGNOSIS — Z7982 Long term (current) use of aspirin: Secondary | ICD-10-CM | POA: Insufficient documentation

## 2020-01-06 DIAGNOSIS — Z20822 Contact with and (suspected) exposure to covid-19: Secondary | ICD-10-CM | POA: Insufficient documentation

## 2020-01-06 DIAGNOSIS — N898 Other specified noninflammatory disorders of vagina: Secondary | ICD-10-CM | POA: Insufficient documentation

## 2020-01-06 DIAGNOSIS — Z79899 Other long term (current) drug therapy: Secondary | ICD-10-CM | POA: Insufficient documentation

## 2020-01-06 DIAGNOSIS — I10 Essential (primary) hypertension: Secondary | ICD-10-CM | POA: Insufficient documentation

## 2020-01-06 LAB — URINALYSIS, ROUTINE W REFLEX MICROSCOPIC
Bilirubin Urine: NEGATIVE
Glucose, UA: NEGATIVE mg/dL
Ketones, ur: NEGATIVE mg/dL
Leukocytes,Ua: NEGATIVE
Nitrite: NEGATIVE
Protein, ur: 30 mg/dL — AB
Specific Gravity, Urine: 1.026 (ref 1.005–1.030)
pH: 5 (ref 5.0–8.0)

## 2020-01-06 LAB — SARS CORONAVIRUS 2 BY RT PCR (HOSPITAL ORDER, PERFORMED IN ~~LOC~~ HOSPITAL LAB): SARS Coronavirus 2: NEGATIVE

## 2020-01-06 LAB — HIV ANTIBODY (ROUTINE TESTING W REFLEX): HIV Screen 4th Generation wRfx: NONREACTIVE

## 2020-01-06 LAB — WET PREP, GENITAL
Sperm: NONE SEEN
Trich, Wet Prep: NONE SEEN
Yeast Wet Prep HPF POC: NONE SEEN

## 2020-01-06 LAB — PREGNANCY, URINE: Preg Test, Ur: NEGATIVE

## 2020-01-06 MED ORDER — AZITHROMYCIN 250 MG PO TABS
1000.0000 mg | ORAL_TABLET | Freq: Once | ORAL | Status: AC
  Administered 2020-01-06: 1000 mg via ORAL
  Filled 2020-01-06: qty 4

## 2020-01-06 MED ORDER — METRONIDAZOLE 500 MG PO TABS: 500.0000 mg | ORAL_TABLET | Freq: Two times a day (BID) | ORAL | 0 refills | Status: AC

## 2020-01-06 MED ORDER — AMLODIPINE BESYLATE 5 MG PO TABS
10.0000 mg | ORAL_TABLET | Freq: Once | ORAL | Status: AC
  Administered 2020-01-06: 10 mg via ORAL
  Filled 2020-01-06: qty 2

## 2020-01-06 MED ORDER — CEFTRIAXONE SODIUM 1 G IJ SOLR
500.0000 mg | Freq: Once | INTRAMUSCULAR | Status: AC
  Administered 2020-01-06: 500 mg via INTRAMUSCULAR
  Filled 2020-01-06: qty 10

## 2020-01-06 MED ORDER — LIDOCAINE HCL (PF) 1 % IJ SOLN
1.0000 mL | Freq: Once | INTRAMUSCULAR | Status: AC
  Administered 2020-01-06: 1 mL
  Filled 2020-01-06: qty 30

## 2020-01-06 MED ORDER — DOXYCYCLINE HYCLATE 100 MG PO TABS: 100.0000 mg | ORAL_TABLET | Freq: Two times a day (BID) | ORAL | 0 refills | Status: DC

## 2020-01-06 MED ORDER — AMLODIPINE BESYLATE 10 MG PO TABS
10.0000 mg | ORAL_TABLET | Freq: Every day | ORAL | 1 refills | Status: DC
Start: 2020-01-06 — End: 2021-01-23

## 2020-01-06 MED ORDER — METRONIDAZOLE 500 MG PO TABS: 500.0000 mg | ORAL_TABLET | Freq: Two times a day (BID) | ORAL | 0 refills | Status: DC

## 2020-01-06 MED FILL — metroNIDAZOLE 500 MG TABS: 500 | 7 days supply | Qty: 14 | Fill #0

## 2020-01-06 NOTE — ED Provider Notes (Addendum)
[facility]-EMERGENCY DEPT Provider Note   CSN: [id number] Arrival date & time: 01/06/20  0759     History Chief Complaint  Patient presents with  . SEXUALLY TRANSMITTED DISEASE  . Hypertension    Becky Vargas is a 49 y.o. female.  HPI   Pt has history of HTN.  She has not been on medications for a while.  Pt has been having sinus congestion.  She does have some pressure in her head and has some pressure in her temples.  NO fevers or chills.  No cough.  Patient also has had some vaginal discharge and a foul odor for the last couple of days.  Pt went to the health department today to be evaluated for her vaginal discharge.  When she checked in there they noted she was very hypertensive.  Patient's blood pressure was 208/121.  Patient denies any numbness or weakness.  She has not had any chest pain or shortness of breath  She has not been vaccinated for covid.  Past Medical History:  Diagnosis Date  . Colitis   . Endometriosis   . Hypertension   . Irritable bowel syndrome (IBS)   . SVT (supraventricular tachycardia) (HCC)     There are no problems to display for this patient.   Past Surgical History:  Procedure Laterality Date  . COLONOSCOPY    . LAPAROSCOPY       OB History    Gravida  2   Para  2   Term  2   Preterm  0   AB  0   Living  2     SAB  0   TAB  0   Ectopic  0   Multiple  0   Live Births              Family History  Problem Relation Age of Onset  . Diabetes Mother   . Diabetes Father   . Hypertension Sister   . Hypertension Brother     Social History   Tobacco Use  . Smoking status: Current Every Day Smoker    Packs/day: 0.35    Types: Cigarettes  . Smokeless tobacco: Never Used  Vaping Use  . Vaping Use: Never used  Substance Use Topics  . Alcohol use: Yes    Alcohol/week: 5.0 standard drinks    Types: 5 Glasses of wine per week  . Drug use: Not Currently    Types: Marijuana    Comment:  occasionally     Home Medications Prior to Admission medications   Medication Sig Start Date End Date Taking? Authorizing Provider  aspirin-acetaminophen-caffeine (EXCEDRIN MIGRAINE) [phone] MG tablet Take 1 tablet by mouth every 6 (six) hours as needed for headache.   Yes [provider]  amLODipine (NORVASC) 10 MG tablet Take 1 tablet (10 mg total) by mouth daily. 01/06/20   Linwood Dibbles, MD  clonazePAM (KLONOPIN) 0.5 MG tablet Take 1 tablet (0.5 mg total) by mouth at bedtime. Patient not taking: Reported on 02/25/2018 01/03/17   Loletta Specter, PA-C  dicyclomine (BENTYL) 20 MG tablet Take 1 tablet (20 mg total) by mouth 3 (three) times daily as needed for spasms. Patient not taking: Reported on 02/25/2018 01/03/17   Loletta Specter, PA-C  doxycycline (VIBRA-TABS) 100 MG tablet Take 1 tablet (100 mg total) by mouth 2 (two) times daily. 01/06/20   Linwood Dibbles, MD  escitalopram (LEXAPRO) 10 MG tablet Take 1 tablet (10 mg total) by mouth daily.  Patient not taking: Reported on 02/25/2018 01/03/17   Loletta Specter, PA-C  hydrochlorothiazide (HYDRODIURIL) 25 MG tablet Take 1 tablet (25 mg total) by mouth daily. Take on tablet in the morning. Patient not taking: Reported on 01/06/2020 11/14/17   Ginger Carne, MD  HYDROcodone-acetaminophen (NORCO/VICODIN) 5-325 MG tablet Take 1-2 tablets by mouth every 4 (four) hours as needed. Patient not taking: Reported on 01/06/2020 02/25/18   Aviva Kluver B, PA-C  metroNIDAZOLE (FLAGYL) 500 MG tablet Take 1 tablet (500 mg total) by mouth 2 (two) times daily for 7 days. 01/06/20 01/13/20  Linwood Dibbles, MD    Allergies    Sulfa antibiotics  Review of Systems   Review of Systems  All other systems reviewed and are negative.   Physical Exam Updated Vital Signs BP (!) 160/94 (BP Location: Left Arm)   Pulse 74   Temp 97.8 F (36.6 C) (Oral)   Resp 16   Ht 1.499 m (4\' 11" )   Wt 45.8 kg   SpO2 100%   BMI 20.40 kg/m   Physical Exam Vitals and  nursing note reviewed.  Constitutional:      General: She is not in acute distress.    Appearance: She is well-developed.  HENT:     Head: Normocephalic and atraumatic.     Right Ear: External ear normal.     Left Ear: External ear normal.  Eyes:     General: No scleral icterus.       Right eye: No discharge.        Left eye: No discharge.     Conjunctiva/sclera: Conjunctivae normal.  Neck:     Trachea: No tracheal deviation.  Cardiovascular:     Rate and Rhythm: Normal rate and regular rhythm.  Pulmonary:     Effort: Pulmonary effort is normal. No respiratory distress.     Breath sounds: Normal breath sounds. No stridor. No wheezing or rales.  Abdominal:     General: Bowel sounds are normal. There is no distension.     Palpations: Abdomen is soft.     Tenderness: There is no abdominal tenderness. There is no guarding or rebound.  Genitourinary:    Labia:        Right: No rash or tenderness.        Left: No rash or tenderness.      Vagina: Vaginal discharge present.     Cervix: Discharge present.     Uterus: Normal.      Adnexa: Right adnexa normal and left adnexa normal.  Musculoskeletal:        General: No tenderness.     Cervical back: Neck supple.  Skin:    General: Skin is warm and dry.     Findings: No rash.  Neurological:     Mental Status: She is alert.     Cranial Nerves: No cranial nerve deficit (no facial droop, extraocular movements intact, no slurred speech).     Sensory: No sensory deficit.     Motor: No abnormal muscle tone or seizure activity.     Coordination: Coordination normal.     ED Results / Procedures / Treatments   Labs (all labs ordered are listed, but only abnormal results are displayed) Labs Reviewed  WET PREP, GENITAL - Abnormal; Notable for the following components:      Result Value   Clue Cells Wet Prep HPF POC PRESENT (*)    WBC, Wet Prep HPF POC MANY (*)    All other components within  normal limits  URINALYSIS, ROUTINE W REFLEX  MICROSCOPIC - Abnormal; Notable for the following components:   Hgb urine dipstick LARGE (*)    Protein, ur 30 (*)    Bacteria, UA FEW (*)    All other components within normal limits  SARS CORONAVIRUS 2 BY RT PCR (HOSPITAL ORDER, PERFORMED IN [facility])  PREGNANCY, URINE  RPR  HIV ANTIBODY (ROUTINE TESTING W REFLEX)  GC/CHLAMYDIA PROBE AMP ([place]) NOT AT [facility]    EKG None  Radiology No results found.  Procedures Procedures (including critical care time)  Medications Ordered in ED Medications  cefTRIAXone (ROCEPHIN) injection 500 mg (has no administration in time range)  lidocaine (PF) (XYLOCAINE) 1 % injection 1 mL (has no administration in time range)  azithromycin (ZITHROMAX) tablet 1,000 mg (has no administration in time range)  amLODipine (NORVASC) tablet 10 mg (10 mg Oral Given 01/06/20 1024)    ED Course  I have reviewed the triage vital signs and the nursing notes.  Pertinent labs & imaging results that were available during my care of the patient were reviewed by me and considered in my medical decision making (see chart for details).  Clinical Course as of Jan 06 1132  Thu Jan 06, 2020  1123 Patient's Covid test is negative.  Wet prep is positive for clue cells and many bacteria.   [JK]    Clinical Course User Index [JK] Linwood Dibbles, MD   MDM Rules/Calculators/A&P                          Pt presented with vaginal discharge.  Positive wet prep.  Will treat for BV and empirically for STD.  Will give her an rx of her blood pressure medication.  No signs of acute complication associated with her blood pressure.  STressed the importance of outpt follow up Final Clinical Impression(s) / ED Diagnoses Final diagnoses:  Hypertension, unspecified type  Vaginal discharge    Rx / DC Orders ED Discharge Orders         Ordered    amLODipine (NORVASC) 10 MG tablet  Daily     Discontinue  Reprint     01/06/20 1128    doxycycline (VIBRA-TABS) 100  MG tablet  2 times daily     Discontinue  Reprint     01/06/20 1128    metroNIDAZOLE (FLAGYL) 500 MG tablet  2 times daily     Discontinue  Reprint     01/06/20 1132           Linwood Dibbles, MD 01/06/20 1133    Linwood Dibbles, MD 01/06/20 [phone]

## 2020-01-06 NOTE — ED Triage Notes (Signed)
Patient states she has had a vaginal discharge and a foul odor x 2 days. Patient states she has had hypertension, but has not had BP meds in several years. Patient also c/o sinus congestion.  BP in triage-164/104

## 2020-01-06 NOTE — Discharge Instructions (Signed)
Take the antibiotics as prescribed. Do not take it with any alcohol, will cause a reaction with the Flagyl. Also start taking your medication for blood pressure. Follow-up with your primary care doctor to be rechecked.

## 2020-01-07 LAB — GC/CHLAMYDIA PROBE AMP (~~LOC~~) NOT AT ARMC
Chlamydia: NEGATIVE
Comment: NEGATIVE
Comment: NORMAL
Neisseria Gonorrhea: NEGATIVE

## 2020-01-07 LAB — RPR: RPR Ser Ql: NONREACTIVE

## 2021-01-12 ENCOUNTER — Emergency Department (HOSPITAL_COMMUNITY)
Admission: EM | Admit: 2021-01-12 | Discharge: 2021-01-12 | Disposition: A | Discharge status: Home / Self Care | Payer: Self-pay | Attending: Emergency Medicine | Admitting: Emergency Medicine

## 2021-01-12 ENCOUNTER — Encounter (HOSPITAL_COMMUNITY): Payer: Self-pay

## 2021-01-12 ENCOUNTER — Other Ambulatory Visit: Payer: Self-pay

## 2021-01-12 ENCOUNTER — Emergency Department (HOSPITAL_COMMUNITY): Payer: Self-pay

## 2021-01-12 DIAGNOSIS — Z7982 Long term (current) use of aspirin: Secondary | ICD-10-CM | POA: Insufficient documentation

## 2021-01-12 DIAGNOSIS — F17210 Nicotine dependence, cigarettes, uncomplicated: Secondary | ICD-10-CM | POA: Insufficient documentation

## 2021-01-12 DIAGNOSIS — R197 Diarrhea, unspecified: Secondary | ICD-10-CM | POA: Insufficient documentation

## 2021-01-12 DIAGNOSIS — Z79899 Other long term (current) drug therapy: Secondary | ICD-10-CM | POA: Insufficient documentation

## 2021-01-12 DIAGNOSIS — R030 Elevated blood-pressure reading, without diagnosis of hypertension: Secondary | ICD-10-CM

## 2021-01-12 DIAGNOSIS — N9489 Other specified conditions associated with female genital organs and menstrual cycle: Secondary | ICD-10-CM | POA: Insufficient documentation

## 2021-01-12 DIAGNOSIS — K648 Other hemorrhoids: Secondary | ICD-10-CM | POA: Insufficient documentation

## 2021-01-12 DIAGNOSIS — K625 Hemorrhage of anus and rectum: Secondary | ICD-10-CM | POA: Insufficient documentation

## 2021-01-12 DIAGNOSIS — I10 Essential (primary) hypertension: Secondary | ICD-10-CM | POA: Insufficient documentation

## 2021-01-12 DIAGNOSIS — R109 Unspecified abdominal pain: Secondary | ICD-10-CM | POA: Insufficient documentation

## 2021-01-12 LAB — LIPASE, BLOOD: Lipase: 30 U/L (ref 11–51)

## 2021-01-12 LAB — CBC WITH DIFFERENTIAL/PLATELET
Abs Immature Granulocytes: 0 10*3/uL (ref 0.00–0.07)
Basophils Absolute: 0 10*3/uL (ref 0.0–0.1)
Basophils Relative: 1 %
Eosinophils Absolute: 0 10*3/uL (ref 0.0–0.5)
Eosinophils Relative: 1 %
HCT: 36.7 % (ref 36.0–46.0)
Hemoglobin: 12.4 g/dL (ref 12.0–15.0)
Immature Granulocytes: 0 %
Lymphocytes Relative: 42 %
Lymphs Abs: 1.7 10*3/uL (ref 0.7–4.0)
MCH: 32.4 pg (ref 26.0–34.0)
MCHC: 33.8 g/dL (ref 30.0–36.0)
MCV: 95.8 fL (ref 80.0–100.0)
Monocytes Absolute: 0.3 10*3/uL (ref 0.1–1.0)
Monocytes Relative: 7 %
Neutro Abs: 2.1 10*3/uL (ref 1.7–7.7)
Neutrophils Relative %: 49 %
Platelets: 245 10*3/uL (ref 150–400)
RBC: 3.83 MIL/uL — ABNORMAL LOW (ref 3.87–5.11)
RDW: 13.2 % (ref 11.5–15.5)
WBC: 4.1 10*3/uL (ref 4.0–10.5)
nRBC: 0 % (ref 0.0–0.2)

## 2021-01-12 LAB — URINALYSIS, ROUTINE W REFLEX MICROSCOPIC
Bilirubin Urine: NEGATIVE
Glucose, UA: NEGATIVE mg/dL
Ketones, ur: NEGATIVE mg/dL
Leukocytes,Ua: NEGATIVE
Nitrite: NEGATIVE
Protein, ur: NEGATIVE mg/dL
Specific Gravity, Urine: 1.012 (ref 1.005–1.030)
pH: 6 (ref 5.0–8.0)

## 2021-01-12 LAB — COMPREHENSIVE METABOLIC PANEL
ALT: 16 U/L (ref 0–44)
AST: 25 U/L (ref 15–41)
Albumin: 4.5 g/dL (ref 3.5–5.0)
Alkaline Phosphatase: 71 U/L (ref 38–126)
Anion gap: 2 — ABNORMAL LOW (ref 5–15)
BUN: 16 mg/dL (ref 6–20)
CO2: 30 mmol/L (ref 22–32)
Calcium: 8.9 mg/dL (ref 8.9–10.3)
Chloride: 106 mmol/L (ref 98–111)
Creatinine, Ser: 0.9 mg/dL (ref 0.44–1.00)
GFR, Estimated: >60 mL/min (ref >60)
Glucose, Bld: 94 mg/dL (ref 70–99)
Potassium: 3.6 mmol/L (ref 3.5–5.1)
Sodium: 138 mmol/L (ref 135–145)
Total Bilirubin: 0.4 mg/dL (ref 0.3–1.2)
Total Protein: 7.5 g/dL (ref 6.5–8.1)

## 2021-01-12 LAB — POC OCCULT BLOOD, ED: Fecal Occult Bld: NEGATIVE

## 2021-01-12 LAB — I-STAT BETA HCG BLOOD, ED (MC, WL, AP ONLY): I-stat hCG, quantitative: <5 m[IU]/mL (ref <5)

## 2021-01-12 MED ORDER — IOHEXOL 350 MG/ML SOLN
80.0000 mL | Freq: Once | INTRAVENOUS | Status: AC | PRN
  Administered 2021-01-12: 80 mL via INTRAVENOUS

## 2021-01-12 MED ORDER — DICYCLOMINE HCL 20 MG PO TABS
20.0000 mg | ORAL_TABLET | Freq: Two times a day (BID) | ORAL | 0 refills | Status: DC | PRN
Start: 2021-01-12 — End: 2021-11-19

## 2021-01-12 NOTE — ED Provider Notes (Signed)
Emergency Medicine Provider Triage Evaluation Note  Becky Vargas , a 50 y.o. female  was evaluated in triage.  Pt complains of abdominal pain and rectal bleeding for the last two days. States that abdomen hurts all over. Has IBS and has had diarrhea for the last two months, has not had blood until two days ago. Unsure if it was in the toilet bowel.   Review of Systems  Positive: Abdominal pain, rectal bleeding  Negative: Nausea, vomiting  Physical Exam  BP (!) 183/95 (BP Location: Left Arm)   Pulse 100   Temp 98 F (36.7 C) (Oral)   Resp 18   SpO2 99%  Gen:   Awake, no distress   Resp:  Normal effort  MSK:   Moves extremities without difficulty  Other:  Generalized abdominal pain  Medical Decision Making  Medically screening exam initiated at 2:36 PM.  Appropriate orders placed.  REAGYN FACEMIRE was informed that the remainder of the evaluation will be completed by another provider, this initial triage assessment does not replace that evaluation, and the importance of remaining in the ED until their evaluation is complete.     Farrel Gordon, PA-C 01/12/21 1439    Derwood Kaplan, MD 01/16/21 1331

## 2021-01-12 NOTE — ED Triage Notes (Signed)
Hx of IBS, pt reports rectal bleed since yesterday morning. Intermittent nausea and upper abd pain x2 months.

## 2021-01-12 NOTE — Discharge Instructions (Addendum)
Get help right away if you have: New or increased rectal bleeding. Black or dark red stools. Vomit with blood or something that looks like coffee grounds. A fainting episode. Severe pain in your rectum.

## 2021-01-12 NOTE — ED Provider Notes (Signed)
[facility]-EMERGENCY DEPT Provider Note   CSN: [id number] Arrival date & time: 01/12/21  1305     History Chief Complaint  Patient presents with   Abdominal Pain    Becky FRIEDEN is a 50 y.o. female with a past medical history of irritable bowel syndrome, SVT, hypertension, endometriosis and colitis who presents emergency department the chief complaint of rectal bleeding.  Patient states that she was having normal bowel movements up until 2 and half months ago when she began having frequent loose stools, tenesmus, cramping, intermittent nausea.  She states that this has been ongoing for the past 2 and half months and 2 days ago she noticed some blood when wiping.  Today she made a bowel movement and noticed significantly more bright red blood on her toilet paper.  She states that the toilet was an automatic flush she was unable to visualize if there was blood in the toilet bowl however she did notice some clots.  She also noted a little bit of stinging around her anus today.  She has seen an gastroenterologist in the past but has not had a colonoscopy in almost 20 years.  She denies any fevers, chills, vomiting. She denies contacts with similar sxs, ingestion of suspect foods or water, history of similar sxs, recent foreign travel .    Abdominal Pain     Past Medical History:  Diagnosis Date   Colitis    Endometriosis    Hypertension    Irritable bowel syndrome (IBS)    SVT (supraventricular tachycardia) (HCC)     There are no problems to display for this patient.   Past Surgical History:  Procedure Laterality Date   COLONOSCOPY     LAPAROSCOPY       OB History     Gravida  2   Para  2   Term  2   Preterm  0   AB  0   Living  2      SAB  0   IAB  0   Ectopic  0   Multiple  0   Live Births              Family History  Problem Relation Age of Onset   Diabetes Mother    Diabetes Father    Hypertension Sister     Hypertension Brother     Social History   Tobacco Use   Smoking status: Every Day    Packs/day: 0.35    Types: Cigarettes   Smokeless tobacco: Never  Vaping Use   Vaping Use: Never used  Substance Use Topics   Alcohol use: Yes    Alcohol/week: 5.0 standard drinks    Types: 5 Glasses of wine per week   Drug use: Not Currently    Types: Marijuana    Comment: occasionally     Home Medications Prior to Admission medications   Medication Sig Start Date End Date Taking? Authorizing Provider  amLODipine (NORVASC) 10 MG tablet Take 1 tablet (10 mg total) by mouth daily. 01/06/20   Linwood Dibbles, MD  aspirin-acetaminophen-caffeine (EXCEDRIN MIGRAINE) [phone] MG tablet Take 1 tablet by mouth every 6 (six) hours as needed for headache.    [provider]  clonazePAM (KLONOPIN) 0.5 MG tablet Take 1 tablet (0.5 mg total) by mouth at bedtime. Patient not taking: Reported on 02/25/2018 01/03/17   Loletta Specter, PA-C  dicyclomine (BENTYL) 20 MG tablet Take 1 tablet (20 mg total) by mouth 3 (three)  times daily as needed for spasms. Patient not taking: Reported on 02/25/2018 01/03/17   Loletta Specter, PA-C  doxycycline (VIBRA-TABS) 100 MG tablet Take 1 tablet (100 mg total) by mouth 2 (two) times daily. 01/06/20   Linwood Dibbles, MD  escitalopram (LEXAPRO) 10 MG tablet Take 1 tablet (10 mg total) by mouth daily. Patient not taking: Reported on 02/25/2018 01/03/17   Loletta Specter, PA-C  hydrochlorothiazide (HYDRODIURIL) 25 MG tablet Take 1 tablet (25 mg total) by mouth daily. Take on tablet in the morning. Patient not taking: Reported on 01/06/2020 11/14/17   Ginger Carne, MD  HYDROcodone-acetaminophen (NORCO/VICODIN) 5-325 MG tablet Take 1-2 tablets by mouth every 4 (four) hours as needed. Patient not taking: Reported on 01/06/2020 02/25/18   Aviva Kluver B, PA-C    Allergies    Sulfa antibiotics  Review of Systems   Review of Systems  Gastrointestinal:  Positive for abdominal  pain.   Physical Exam Updated Vital Signs BP (!) 183/95 (BP Location: Left Arm)   Pulse 100   Temp 98 F (36.7 C) (Oral)   Resp 18   Ht 4\' 11"  (1.499 m)   Wt 45 kg   SpO2 99%   BMI 20.04 kg/m   Physical Exam Vitals and nursing note reviewed.  Constitutional:      General: She is not in acute distress.    Appearance: She is well-developed. She is not diaphoretic.  HENT:     Head: Normocephalic and atraumatic.     Right Ear: External ear normal.     Left Ear: External ear normal.     Nose: Nose normal.     Mouth/Throat:     Mouth: Mucous membranes are moist.  Eyes:     General: No scleral icterus.    Conjunctiva/sclera: Conjunctivae normal.  Cardiovascular:     Rate and Rhythm: Normal rate and regular rhythm.     Heart sounds: Normal heart sounds. No murmur heard.   No friction rub. No gallop.  Pulmonary:     Effort: Pulmonary effort is normal. No respiratory distress.     Breath sounds: Normal breath sounds.  Abdominal:     General: Bowel sounds are normal. There is no distension.     Palpations: Abdomen is soft. There is no mass.     Tenderness: There is no abdominal tenderness. There is no guarding.  Genitourinary:    Comments: Digital Rectal Exam reveals sphincter with good tone. Non thrombosed external hemorrhoids. No masses or fissures.  No overt blood or stool on exam Musculoskeletal:     Cervical back: Normal range of motion.  Skin:    General: Skin is warm and dry.  Neurological:     Mental Status: She is alert and oriented to person, place, and time.  Psychiatric:        Behavior: Behavior normal.    ED Results / Procedures / Treatments   Labs (all labs ordered are listed, but only abnormal results are displayed) Labs Reviewed  CBC WITH DIFFERENTIAL/PLATELET - Abnormal; Notable for the following components:      Result Value   RBC 3.83 (*)    All other components within normal limits  COMPREHENSIVE METABOLIC PANEL  LIPASE, BLOOD  URINALYSIS,  ROUTINE W REFLEX MICROSCOPIC  I-STAT BETA HCG BLOOD, ED ([facility], [facility], [facility] ONLY)    EKG None  Radiology No results found.  Procedures Procedures   Medications Ordered in ED Medications - No data to display  ED Course  I  have reviewed the triage vital signs and the nursing notes.  Pertinent labs & imaging results that were available during my care of the patient were reviewed by me and considered in my medical decision making (see chart for details).    MDM Rules/Calculators/A&P                           Patient here with complaint of frequent loose stools and rectal bleeding.The differential diagnosis for lower GI bleed includes but is not limited to high flow upper GI bleed, diverticulosis or radiculitis, vascular ectasia/arteriovenous malformation, inflammatory bowel disease, infectious colitis, mesenteric ischemia or ischemic colitis, Meckel's diverticulum, colorectal cancer or polyps, internal hemorrhoids, aortoenteric fistula, rectal foreign body, rectal ulceration or anal fissure. No blood on exam.  I ordered and reviewed labs that included CMP without abnormality, CBC with no anemia, urinalysis is negative for abnormality.  Lipase and pregnancy test within normal limits, negative point-of-care blood test for stool.  CT abdomen and pelvis ordered at triage is negative for acute abnormality.  I personally reviewed and interpreted results.  Patient will be given outpatient follow-up with gastroenterology, Bentyl for tenesmus and cramping.  Discussed need for close follow-up and return precautions.  She will follow-up with primary care for her elevated blood pressure.  Otherwise appropriate for discharge at this time  Final Clinical Impression(s) / ED Diagnoses Final diagnoses:  None    Rx / DC Orders ED Discharge Orders     None        Arthor Captain, PA-C 01/12/21 1714    Melene Plan, DO 01/12/21 1747

## 2021-01-23 ENCOUNTER — Encounter (HOSPITAL_COMMUNITY): Payer: Self-pay | Admitting: Emergency Medicine

## 2021-01-23 ENCOUNTER — Emergency Department (HOSPITAL_COMMUNITY)
Admission: EM | Admit: 2021-01-23 | Discharge: 2021-01-23 | Disposition: A | Discharge status: Home / Self Care | Payer: Medicaid Other | Attending: Emergency Medicine | Admitting: Emergency Medicine

## 2021-01-23 ENCOUNTER — Other Ambulatory Visit: Payer: Self-pay

## 2021-01-23 DIAGNOSIS — I10 Essential (primary) hypertension: Secondary | ICD-10-CM | POA: Insufficient documentation

## 2021-01-23 DIAGNOSIS — E876 Hypokalemia: Secondary | ICD-10-CM | POA: Insufficient documentation

## 2021-01-23 DIAGNOSIS — R195 Other fecal abnormalities: Secondary | ICD-10-CM | POA: Insufficient documentation

## 2021-01-23 DIAGNOSIS — Z79899 Other long term (current) drug therapy: Secondary | ICD-10-CM | POA: Insufficient documentation

## 2021-01-23 DIAGNOSIS — F17210 Nicotine dependence, cigarettes, uncomplicated: Secondary | ICD-10-CM | POA: Insufficient documentation

## 2021-01-23 LAB — COMPREHENSIVE METABOLIC PANEL
ALT: 14 U/L (ref 0–44)
AST: 22 U/L (ref 15–41)
Albumin: 3.8 g/dL (ref 3.5–5.0)
Alkaline Phosphatase: 65 U/L (ref 38–126)
Anion gap: 8 (ref 5–15)
BUN: 10 mg/dL (ref 6–20)
CO2: 24 mmol/L (ref 22–32)
Calcium: 8.7 mg/dL — ABNORMAL LOW (ref 8.9–10.3)
Chloride: 108 mmol/L (ref 98–111)
Creatinine, Ser: 0.82 mg/dL (ref 0.44–1.00)
GFR, Estimated: >60 mL/min (ref >60)
Glucose, Bld: 88 mg/dL (ref 70–99)
Potassium: 3.4 mmol/L — ABNORMAL LOW (ref 3.5–5.1)
Sodium: 140 mmol/L (ref 135–145)
Total Bilirubin: 0.5 mg/dL (ref 0.3–1.2)
Total Protein: 6.5 g/dL (ref 6.5–8.1)

## 2021-01-23 LAB — TYPE AND SCREEN
ABO/RH(D): B POS
Antibody Screen: NEGATIVE

## 2021-01-23 LAB — CBC
HCT: 37 % (ref 36.0–46.0)
Hemoglobin: 12.5 g/dL (ref 12.0–15.0)
MCH: 32.2 pg (ref 26.0–34.0)
MCHC: 33.8 g/dL (ref 30.0–36.0)
MCV: 95.4 fL (ref 80.0–100.0)
Platelets: 280 10*3/uL (ref 150–400)
RBC: 3.88 MIL/uL (ref 3.87–5.11)
RDW: 13.5 % (ref 11.5–15.5)
WBC: 4.8 10*3/uL (ref 4.0–10.5)
nRBC: 0 % (ref 0.0–0.2)

## 2021-01-23 LAB — I-STAT BETA HCG BLOOD, ED (MC, WL, AP ONLY): I-stat hCG, quantitative: <5 m[IU]/mL (ref <5)

## 2021-01-23 LAB — POC OCCULT BLOOD, ED: Fecal Occult Bld: NEGATIVE

## 2021-01-23 LAB — ABO/RH: ABO/RH(D): B POS

## 2021-01-23 MED ORDER — HYDROCHLOROTHIAZIDE 25 MG PO TABS: 25.0000 mg | ORAL_TABLET | Freq: Every day | ORAL | 0 refills | Status: DC

## 2021-01-23 NOTE — ED Triage Notes (Signed)
Pt reports having "a lot of blood in my stool this morning."  Reports no pain at this time.  Pt informed RN that it could have been from the red snow cone she had yesterday.

## 2021-01-23 NOTE — Discharge Instructions (Addendum)
Please call number on your discharge papers to establish primary care Your prescription for hydrochlorothiazide has been refilled The stool is negative for blood in your blood count is normal today.

## 2021-01-23 NOTE — ED Notes (Signed)
Pt verbalizes understanding of discharge instructions. Opportunity for questions and answers were provided. Pt discharged from the ED.   ?

## 2021-01-23 NOTE — ED Provider Notes (Signed)
[facility] EMERGENCY DEPARTMENT Provider Note   CSN: [id number] Arrival date & time: 01/23/21  5102     History Chief Complaint  Patient presents with   Blood In Stools    Becky Vargas is a 50 y.o. female.  HPI 50 year old female history of SVT, colitis, irritable bowel syndrome, presents today complaining of concern for blood in her stool.  She states that she had an episode 10 days ago with blood in her stool.  However, she has not automatically flushing commode was unable to see exactly what it looked like before it flushed.  She did note blood on her toilet paper afterwards.  She believes this was all rectal bleeding.  She is postmenopausal has not had a period for 4 years.  Denies any urinary bleeding.  She states that she had some crampy pain around 3 AM and had to use the toilet.  She then noted red stool.  She is not sure if this is blood or from when she had a red-colored [place] ice yesterday.  She did have 2 more episodes of stooling this morning without blood.  She denies lightheadedness.  She has a history of hypertension but has not been taking her meds due to financial constraints.  She denies any lightheadedness, dyspnea, chest pain, nausea, vomiting, change in weight.  She is not currently taking regular over-the-counter medications or blood thinners.     Past Medical History:  Diagnosis Date   Colitis    Endometriosis    Hypertension    Irritable bowel syndrome (IBS)    SVT (supraventricular tachycardia) (HCC)     There are no problems to display for this patient.   Past Surgical History:  Procedure Laterality Date   COLONOSCOPY     LAPAROSCOPY       OB History     Gravida  2   Para  2   Term  2   Preterm  0   AB  0   Living  2      SAB  0   IAB  0   Ectopic  0   Multiple  0   Live Births              Family History  Problem Relation Age of Onset   Diabetes Mother    Diabetes Father    Hypertension Sister     Hypertension Brother     Social History   Tobacco Use   Smoking status: Every Day    Packs/day: 0.35    Types: Cigarettes   Smokeless tobacco: Never  Vaping Use   Vaping Use: Never used  Substance Use Topics   Alcohol use: Yes    Alcohol/week: 5.0 standard drinks    Types: 5 Glasses of wine per week   Drug use: Not Currently    Types: Marijuana    Comment: occasionally     Home Medications Prior to Admission medications   Medication Sig Start Date End Date Taking? Authorizing Provider  amLODipine (NORVASC) 10 MG tablet Take 1 tablet (10 mg total) by mouth daily. 01/06/20   Linwood Dibbles, MD  aspirin-acetaminophen-caffeine (EXCEDRIN MIGRAINE) [phone] MG tablet Take 1 tablet by mouth every 6 (six) hours as needed for headache.    [provider]  clonazePAM (KLONOPIN) 0.5 MG tablet Take 1 tablet (0.5 mg total) by mouth at bedtime. Patient not taking: Reported on 02/25/2018 01/03/17   Loletta Specter, PA-C  dicyclomine (BENTYL) 20 MG tablet Take  1 tablet (20 mg total) by mouth 3 (three) times daily as needed for spasms. Patient not taking: Reported on 02/25/2018 01/03/17   Loletta Specter, PA-C  dicyclomine (BENTYL) 20 MG tablet Take 1 tablet (20 mg total) by mouth 2 (two) times daily as needed for spasms (for spasm). 01/12/21   Arthor Captain, PA-C  doxycycline (VIBRA-TABS) 100 MG tablet Take 1 tablet (100 mg total) by mouth 2 (two) times daily. 01/06/20   Linwood Dibbles, MD  escitalopram (LEXAPRO) 10 MG tablet Take 1 tablet (10 mg total) by mouth daily. Patient not taking: Reported on 02/25/2018 01/03/17   Loletta Specter, PA-C  hydrochlorothiazide (HYDRODIURIL) 25 MG tablet Take 1 tablet (25 mg total) by mouth daily. Take on tablet in the morning. Patient not taking: Reported on 01/06/2020 11/14/17   Ginger Carne, MD  HYDROcodone-acetaminophen (NORCO/VICODIN) 5-325 MG tablet Take 1-2 tablets by mouth every 4 (four) hours as needed. Patient not taking: Reported on 01/06/2020  02/25/18   Aviva Kluver B, PA-C    Allergies    Sulfa antibiotics  Review of Systems   Review of Systems  All other systems reviewed and are negative.  Physical Exam Updated Vital Signs BP (!) 171/97 (BP Location: Left Arm)   Pulse 72   Temp 98.7 F (37.1 C) (Oral)   Resp 17   SpO2 99%   Physical Exam Vitals and nursing note reviewed. Exam conducted with a chaperone present.  Constitutional:      Appearance: Normal appearance.  HENT:     Head: Normocephalic.     Right Ear: External ear normal.     Left Ear: External ear normal.     Nose: Nose normal.     Mouth/Throat:     Pharynx: Oropharynx is clear.  Eyes:     Extraocular Movements: Extraocular movements intact.     Pupils: Pupils are equal, round, and reactive to light.  Cardiovascular:     Rate and Rhythm: Normal rate and regular rhythm.  Pulmonary:     Effort: Pulmonary effort is normal.     Breath sounds: Normal breath sounds.  Abdominal:     General: Abdomen is flat. Bowel sounds are normal. There is no distension.     Palpations: Abdomen is soft. There is no mass.     Tenderness: There is no abdominal tenderness.     Hernia: No hernia is present.  Genitourinary:    Rectum: Normal.  Musculoskeletal:        General: Normal range of motion.     Cervical back: Normal range of motion.  Skin:    General: Skin is warm and dry.     Capillary Refill: Capillary refill takes less than 2 seconds.  Neurological:     General: No focal deficit present.     Mental Status: She is alert.  Psychiatric:        Mood and Affect: Mood normal.        Behavior: Behavior normal.    ED Results / Procedures / Treatments   Labs (all labs ordered are listed, but only abnormal results are displayed) Labs Reviewed  COMPREHENSIVE METABOLIC PANEL - Abnormal; Notable for the following components:      Result Value   Potassium 3.4 (*)    Calcium 8.7 (*)    All other components within normal limits  CBC  I-STAT BETA HCG  BLOOD, ED ([facility], [facility], [facility] ONLY)  POC OCCULT BLOOD, ED  POC OCCULT BLOOD, ED  TYPE AND SCREEN  ABO/RH    EKG None  Radiology No results found.  Procedures Procedures   Medications Ordered in ED Medications - No data to display  ED Course  I have reviewed the triage vital signs and the nursing notes.  Pertinent labs & imaging results that were available during my care of the patient were reviewed by me and considered in my medical decision making (see chart for details).  Clinical Course as of 01/23/21 1855  Tue Jan 23, 2021  0950 Labs reviewed including CBC with normal hemoglobin and normal platelets.  Patient is mildly hypokalemic with potassium of 3.4 Point-of-care blood stool testing pending  [DR]  [phone] Patient hypertensive with known history of hypertension.  She has not been taking her medications.  She has had financial constraints.  She does not know what medication she is taking in the past.  Review of records reveal that she was on hydrochlorothiazide. [DR]  2671 Stool is heme negative.  Was likely due to the red dye. [DR]    Clinical Course User Index [DR] Margarita Grizzle, MD   MDM Rules/Calculators/A&P                          Patient with concern for rectal bleeding.  However, she did have red dye yesterday and here the stool although red-orange in color is not consistent with blood.  It was tested with Hemoccult and is negative.  Patient appears stable for discharge.  She has a history of hypertension and has been on chlorothiazide in the past.  This is refilled.  Patient is advised regarding need to establish primary care, follow-up, and return precautions.  Final Clinical Impression(s) / ED Diagnoses Final diagnoses:  Hypertension, unspecified type  Red stool    Rx / DC Orders ED Discharge Orders     None        Margarita Grizzle, MD 01/23/21 2458

## 2021-01-23 NOTE — ED Provider Notes (Signed)
Emergency Medicine Provider Triage Evaluation Note  Becky Vargas , a 50 y.o. female  was evaluated in triage.  Pt complains of bloody stools.  The patient reports that she was seen at [facility] the emergency department on August 5 for an episode of bright red blood per rectum accompanied by abdominal cramping.  Symptoms resolved until this morning when she developed abdominal cramping and had a large bowel movement with bright red blood per rectum.  States that she brought a sample of stool to the emergency department for further evaluation.  No vomiting, hematemesis, fever, chills, or melena.  Review of Systems  Positive: Hematochezia, abdominal pain Negative: Vomiting, hematemesis, fever, chills, melena  Physical Exam  BP (!) 196/109 (BP Location: Left Arm)   Pulse 72   Temp 97.8 F (36.6 C)   Resp 17   SpO2 97%  Gen:   Awake, no distress   Resp:  Normal effort  MSK:   Moves extremities without difficulty  Other:  Abdomen with mild generalized tenderness to palpation.  No rebound or guarding.  Medical Decision Making  Medically screening exam initiated at 5:07 AM.  Appropriate orders placed.  Becky Vargas was informed that the remainder of the evaluation will be completed by another provider, this initial triage assessment does not replace that evaluation, and the importance of remaining in the ED until their evaluation is complete.  Labs and imaging have been ordered.  She will require further work-up and evaluation in the emergency department.   Barkley Boards, PA-C 01/23/21 1610    Glynn Octave, MD 01/23/21 [phone]

## 2021-08-10 ENCOUNTER — Encounter (HOSPITAL_COMMUNITY): Payer: Self-pay

## 2021-08-10 ENCOUNTER — Emergency Department (HOSPITAL_COMMUNITY)
Admission: EM | Admit: 2021-08-10 | Discharge: 2021-08-10 | Disposition: A | Discharge status: Home / Self Care | Payer: Self-pay | Attending: Emergency Medicine | Admitting: Emergency Medicine

## 2021-08-10 ENCOUNTER — Emergency Department (HOSPITAL_COMMUNITY): Payer: Self-pay

## 2021-08-10 ENCOUNTER — Other Ambulatory Visit: Payer: Self-pay

## 2021-08-10 DIAGNOSIS — I10 Essential (primary) hypertension: Secondary | ICD-10-CM | POA: Insufficient documentation

## 2021-08-10 DIAGNOSIS — J069 Acute upper respiratory infection, unspecified: Secondary | ICD-10-CM | POA: Insufficient documentation

## 2021-08-10 DIAGNOSIS — Z79899 Other long term (current) drug therapy: Secondary | ICD-10-CM | POA: Insufficient documentation

## 2021-08-10 DIAGNOSIS — Z20822 Contact with and (suspected) exposure to covid-19: Secondary | ICD-10-CM | POA: Insufficient documentation

## 2021-08-10 DIAGNOSIS — E876 Hypokalemia: Secondary | ICD-10-CM | POA: Insufficient documentation

## 2021-08-10 DIAGNOSIS — J029 Acute pharyngitis, unspecified: Secondary | ICD-10-CM

## 2021-08-10 LAB — CBC WITH DIFFERENTIAL/PLATELET
Abs Immature Granulocytes: 0.02 10*3/uL (ref 0.00–0.07)
Basophils Absolute: 0.1 10*3/uL (ref 0.0–0.1)
Basophils Relative: 1 %
Eosinophils Absolute: 0 10*3/uL (ref 0.0–0.5)
Eosinophils Relative: 0 %
HCT: 39.5 % (ref 36.0–46.0)
Hemoglobin: 13.7 g/dL (ref 12.0–15.0)
Immature Granulocytes: 0 %
Lymphocytes Relative: 29 %
Lymphs Abs: 2.2 10*3/uL (ref 0.7–4.0)
MCH: 32.2 pg (ref 26.0–34.0)
MCHC: 34.7 g/dL (ref 30.0–36.0)
MCV: 92.7 fL (ref 80.0–100.0)
Monocytes Absolute: 0.8 10*3/uL (ref 0.1–1.0)
Monocytes Relative: 11 %
Neutro Abs: 4.5 10*3/uL (ref 1.7–7.7)
Neutrophils Relative %: 59 %
Platelets: 288 10*3/uL (ref 150–400)
RBC: 4.26 MIL/uL (ref 3.87–5.11)
RDW: 12.6 % (ref 11.5–15.5)
WBC: 7.6 10*3/uL (ref 4.0–10.5)
nRBC: 0 % (ref 0.0–0.2)

## 2021-08-10 LAB — BASIC METABOLIC PANEL
Anion gap: 9 (ref 5–15)
BUN: 14 mg/dL (ref 6–20)
CO2: 27 mmol/L (ref 22–32)
Calcium: 8.9 mg/dL (ref 8.9–10.3)
Chloride: 100 mmol/L (ref 98–111)
Creatinine, Ser: 0.77 mg/dL (ref 0.44–1.00)
GFR, Estimated: >60 mL/min (ref >60)
Glucose, Bld: 93 mg/dL (ref 70–99)
Potassium: 3.3 mmol/L — ABNORMAL LOW (ref 3.5–5.1)
Sodium: 136 mmol/L (ref 135–145)

## 2021-08-10 LAB — RESP PANEL BY RT-PCR (FLU A&B, COVID) ARPGX2
Influenza A by PCR: NEGATIVE
Influenza B by PCR: NEGATIVE
SARS Coronavirus 2 by RT PCR: NEGATIVE

## 2021-08-10 LAB — GROUP A STREP BY PCR: Group A Strep by PCR: NOT DETECTED

## 2021-08-10 MED ORDER — LIDOCAINE VISCOUS HCL 2 % MT SOLN: 15.0000 mL | OROMUCOSAL | 0 refills | Status: AC | PRN

## 2021-08-10 MED ORDER — DEXAMETHASONE SODIUM PHOSPHATE 10 MG/ML IJ SOLN
10.0000 mg | Freq: Once | INTRAMUSCULAR | Status: AC
  Administered 2021-08-10: 10 mg via INTRAVENOUS
  Filled 2021-08-10: qty 1

## 2021-08-10 MED ORDER — POTASSIUM CHLORIDE CRYS ER 20 MEQ PO TBCR
40.0000 meq | EXTENDED_RELEASE_TABLET | Freq: Once | ORAL | Status: AC
  Administered 2021-08-10: 40 meq via ORAL
  Filled 2021-08-10: qty 2

## 2021-08-10 MED ORDER — ACETAMINOPHEN 325 MG PO TABS
650.0000 mg | ORAL_TABLET | Freq: Once | ORAL | Status: AC
  Administered 2021-08-10: 650 mg via ORAL
  Filled 2021-08-10: qty 2

## 2021-08-10 MED ORDER — LIDOCAINE VISCOUS HCL 2 % MT SOLN
15.0000 mL | Freq: Once | OROMUCOSAL | Status: AC
  Administered 2021-08-10: 15 mL via OROMUCOSAL
  Filled 2021-08-10: qty 15

## 2021-08-10 MED ORDER — BENZONATATE 100 MG PO CAPS
100.0000 mg | ORAL_CAPSULE | Freq: Once | ORAL | Status: AC
  Administered 2021-08-10: 100 mg via ORAL
  Filled 2021-08-10: qty 1

## 2021-08-10 MED ORDER — SODIUM CHLORIDE 0.9 % IV BOLUS
1000.0000 mL | Freq: Once | INTRAVENOUS | Status: AC
  Administered 2021-08-10: 1000 mL via INTRAVENOUS

## 2021-08-10 MED ORDER — BENZONATATE 100 MG PO CAPS: 100.0000 mg | ORAL_CAPSULE | Freq: Three times a day (TID) | ORAL | 0 refills | Status: DC

## 2021-08-10 MED ORDER — HYDROCHLOROTHIAZIDE 25 MG PO TABS: 25.0000 mg | ORAL_TABLET | Freq: Every day | ORAL | 0 refills | Status: DC

## 2021-08-10 NOTE — Discharge Instructions (Signed)
Return if you have increased difficulty swallowing, especially if you are unable to swallow your own saliva.  Drink plenty of fluids, you can use ibuprofen, Tylenol for pain.  Please follow-up with your primary care doctor to ensure that your blood pressure is improving after restarting your blood pressure medication.  Please return if you have chest pain, shortness of breath, headache. ?

## 2021-08-10 NOTE — ED Provider Notes (Signed)
[facility]-EMERGENCY DEPT Provider Note   CSN: [id number] Arrival date & time: 08/10/21  1403     History  Chief Complaint  Patient presents with   Cough    Becky Vargas is a 51 y.o. female with past medical history significant for hypertension, irritable bowel syndrome who presents with concern for cough, congestion, body aches, as well as cramping in her hands and feet, as well as significant sore throat.  She reports this is been ongoing for the last 3 to 5 days.  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 Denies chest pain, hx of asthma, COPD, shob, NVD.  In the triage note she was endorsing numbness in her hands and feet, on my evaluation she reports that this is more of a "cramping", denies any numbness.  Reports significant pain with swallowing but reports that she has been able to tolerate her own saliva.   Cough Associated symptoms: myalgias and sore throat       Home Medications Prior to Admission medications   Medication Sig Start Date End Date Taking? Authorizing Provider  benzonatate (TESSALON) 100 MG capsule Take 1 capsule (100 mg total) by mouth every 8 (eight) hours. 08/10/21  Yes Grayer Sproles H, PA-C  hydrochlorothiazide (HYDRODIURIL) 25 MG tablet Take 1 tablet (25 mg total) by mouth daily. 08/10/21  Yes Natale Thoma H, PA-C  lidocaine (XYLOCAINE) 2 % solution Use as directed 15 mLs in the mouth or throat as needed for mouth pain. 08/10/21  Yes Eliyahu Bille H, PA-C  aspirin-acetaminophen-caffeine (EXCEDRIN MIGRAINE) [phone] MG tablet Take 1 tablet by mouth every 6 (six) hours as needed for headache.    [provider]  clonazePAM (KLONOPIN) 0.5 MG tablet Take 1 tablet (0.5 mg total) by mouth at bedtime. Patient not taking: Reported on 02/25/2018 01/03/17   Loletta Specter, PA-C  dicyclomine (BENTYL) 20 MG tablet Take 1 tablet (20 mg total) by mouth 3 (three) times daily as  needed for spasms. Patient not taking: Reported on 02/25/2018 01/03/17   Loletta Specter, PA-C  dicyclomine (BENTYL) 20 MG tablet Take 1 tablet (20 mg total) by mouth 2 (two) times daily as needed for spasms (for spasm). 01/12/21   Arthor Captain, PA-C  doxycycline (VIBRA-TABS) 100 MG tablet Take 1 tablet (100 mg total) by mouth 2 (two) times daily. 01/06/20   Linwood Dibbles, MD  escitalopram (LEXAPRO) 10 MG tablet Take 1 tablet (10 mg total) by mouth daily. Patient not taking: Reported on 02/25/2018 01/03/17   Loletta Specter, PA-C  HYDROcodone-acetaminophen (NORCO/VICODIN) 5-325 MG tablet Take 1-2 tablets by mouth every 4 (four) hours as needed. Patient not taking: Reported on 01/06/2020 02/25/18   Aviva Kluver B, PA-C      Allergies    Sulfa antibiotics    Review of Systems   Review of Systems  HENT:  Positive for sore throat.   Respiratory:  Positive for cough.   Musculoskeletal:  Positive for myalgias.  All other systems reviewed and are negative.  Physical Exam Updated Vital Signs BP (!) 190/114    Pulse 94    Temp 98.6 F (37 C) (Oral)    Resp 17    Ht 4\' 11"  (1.499 m)    Wt 44.5 kg    LMP 11/17/2016 Comment: Upreg neg 12/26/16   SpO2 99%    BMI 19.79 kg/m  Physical Exam Vitals and nursing note reviewed.  Constitutional:  General: She is not in acute distress.    Appearance: Normal appearance.  HENT:     Head: Normocephalic and atraumatic.     Mouth/Throat:     Comments: Significant erythema posterior oropharynx without exudate noted.  Tonsils are 1+ bilaterally.  No peritonsillar abscess noted.  Uvula midline. Eyes:     General:        Right eye: No discharge.        Left eye: No discharge.  Cardiovascular:     Rate and Rhythm: Normal rate and regular rhythm.     Heart sounds: No murmur heard.   No friction rub. No gallop.  Pulmonary:     Effort: Pulmonary effort is normal.     Breath sounds: Normal breath sounds.  Abdominal:     General: Bowel sounds are normal.      Palpations: Abdomen is soft.  Musculoskeletal:     Cervical back: Neck supple. Tenderness present. No rigidity.  Lymphadenopathy:     Cervical: Cervical adenopathy present.  Skin:    General: Skin is warm and dry.     Capillary Refill: Capillary refill takes less than 2 seconds.  Neurological:     Mental Status: She is alert and oriented to person, place, and time.  Psychiatric:        Mood and Affect: Mood normal.        Behavior: Behavior normal.    ED Results / Procedures / Treatments   Labs (all labs ordered are listed, but only abnormal results are displayed) Labs Reviewed  BASIC METABOLIC PANEL - Abnormal; Notable for the following components:      Result Value   Potassium 3.3 (*)    All other components within normal limits  RESP PANEL BY RT-PCR (FLU A&B, COVID) [id number]  GROUP A STREP BY PCR  CBC WITH DIFFERENTIAL/PLATELET    EKG None  Radiology DG Chest 2 View  Result Date: 08/10/2021 CLINICAL DATA:  Shortness of breath. Cough, congestion and sore throat for 2-3 weeks. EXAM: CHEST - 2 VIEW COMPARISON:  09/09/2016 FINDINGS: The heart size and mediastinal contours are within normal limits. Both lungs are clear. The visualized skeletal structures are unremarkable. IMPRESSION: No active cardiopulmonary disease. Electronically Signed   By: Signa Kell M.D.   On: 08/10/2021 14:59    Procedures Procedures    Medications Ordered in ED Medications  dexamethasone (DECADRON) injection 10 mg (10 mg Intravenous Given 08/10/21 1540)  sodium chloride 0.9 % bolus 1,000 mL (0 mLs Intravenous Stopped 08/10/21 1646)  acetaminophen (TYLENOL) tablet 650 mg (650 mg Oral Given 08/10/21 1540)  lidocaine (XYLOCAINE) 2 % viscous mouth solution 15 mL (15 mLs Mouth/Throat Given 08/10/21 1540)  benzonatate (TESSALON) capsule 100 mg (100 mg Oral Given 08/10/21 1635)  potassium chloride SA (KLOR-CON M) CR tablet 40 mEq (40 mEq Oral Given 08/10/21 1652)    ED Course/ Medical Decision Making/  A&P                           Medical Decision Making Amount and/or Complexity of Data Reviewed Labs: ordered. Radiology: ordered.  Risk OTC drugs. Prescription drug management.   This patient presents to the ED for concern of sore throat, cough, myalgias, cramping of hands and feet, this involves an extensive number of treatment options, and is a complaint that carries with it a high risk of complications and morbidity. The emergent differential diagnosis prior to evaluation includes, but is not limited  to, electrolyte abnormality, epiglottitis, peritonsillar abscess, acute strep, Ludwig angina, COVID, flu versus other upper respiratory infection.  This is not an exhaustive differential..   Past Medical History / Co-morbidities: History of tobacco smoking, patient endorses 6 cigarettes/day although she has not smoked this week, hypertension, irritable bowel syndrome  Additional history: External records from outside source obtained and reviewed including recent urgent care, OB/GYN, family medicine visits.  Physical Exam: Physical exam performed. The pertinent findings include: Patient with erythematous posterior pharynx without any evidence of peritonsillar abscess, soft tissue swelling of the neck, soft palate, floor of mouth.  Normal breath sounds bilaterally without wheezing.  No stridor.  No abdominal tenderness to palpation.  No neck rigidity.tes  Lab Tests: I ordered, and personally interpreted labs.  The pertinent results include: RVP negative for COVID, flu, strep negative.  Unremarkable CBC, BMP remarkable only for mild hypokalemia, we will orally replete.   Imaging Studies: I ordered imaging studies including chest x-ray. I independently visualized and interpreted imaging which showed no intrathoracic abnormality. I agree with the radiologist interpretation.   Medications: I ordered medication including Tylenol, Tessalon, fluid bolus, viscous lidocaine for throat discomfort,  coughing Decadron for sore throat and pharyngitis for help with resolution of symptoms, and potassium for hypokalemia. Reevaluation of the patient after these medicines showed that the patient resolved. I have reviewed the patients home medicines and have made adjustments as needed.  Patient endorses significant improvement of her symptoms after medications as discussed above she is requesting to go home at this time.  She is noted to have elevated blood pressure with systolic 190.  She denies any chest pain, headache, blurry vision, or any other evidence of endorgan damage.  She has no evidence of kidney injury on her BMP today.  Patient reports that she does have a history of hypertension, and normally takes medication but she has been out for a while.  Discussed with patient we will refill her hydrochlorothiazide, and send her new prescription for Tessalon, as well as viscous lidocaine to help with her sore throat.  Discussed presumed diagnosis of viral pharyngitis and symptomatic care.  Extensive return precautions given including inability to tolerate oral secretions, difficulty breathing.  Patient understands agrees to plan, she is discharged in stable condition at this time.  Final Clinical Impression(s) / ED Diagnoses Final diagnoses:  Viral URI with cough  Sore throat    Rx / DC Orders ED Discharge Orders          Ordered    benzonatate (TESSALON) 100 MG capsule  Every 8 hours        08/10/21 1734    lidocaine (XYLOCAINE) 2 % solution  As needed        08/10/21 1734    hydrochlorothiazide (HYDRODIURIL) 25 MG tablet  Daily        08/10/21 1734              Montel Clock Lavina, PA-C 08/10/21 1901    Wynetta Fines, MD 08/11/21 [phone]

## 2021-08-10 NOTE — ED Triage Notes (Signed)
Pt presents with c/o cough, pain/numbness in her hands and feet, as well as a sore throat. 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  ?

## 2021-09-08 ENCOUNTER — Emergency Department (HOSPITAL_COMMUNITY): Payer: Self-pay

## 2021-09-08 ENCOUNTER — Other Ambulatory Visit: Payer: Self-pay

## 2021-09-08 ENCOUNTER — Encounter (HOSPITAL_COMMUNITY): Payer: Self-pay

## 2021-09-08 ENCOUNTER — Emergency Department (HOSPITAL_COMMUNITY)
Admission: EM | Admit: 2021-09-08 | Discharge: 2021-09-08 | Disposition: A | Discharge status: Home / Self Care | Payer: Self-pay | Attending: Emergency Medicine | Admitting: Emergency Medicine

## 2021-09-08 DIAGNOSIS — S3993XA Unspecified injury of pelvis, initial encounter: Secondary | ICD-10-CM | POA: Insufficient documentation

## 2021-09-08 DIAGNOSIS — W11XXXA Fall on and from ladder, initial encounter: Secondary | ICD-10-CM | POA: Insufficient documentation

## 2021-09-08 DIAGNOSIS — Z7982 Long term (current) use of aspirin: Secondary | ICD-10-CM | POA: Insufficient documentation

## 2021-09-08 DIAGNOSIS — Z79899 Other long term (current) drug therapy: Secondary | ICD-10-CM | POA: Insufficient documentation

## 2021-09-08 DIAGNOSIS — Y990 Civilian activity done for income or pay: Secondary | ICD-10-CM | POA: Insufficient documentation

## 2021-09-08 MED ORDER — OXYCODONE-ACETAMINOPHEN 5-325 MG PO TABS
1.0000 | ORAL_TABLET | Freq: Once | ORAL | Status: AC
  Administered 2021-09-08: 1 via ORAL
  Filled 2021-09-08: qty 1

## 2021-09-08 MED ORDER — OXYCODONE-ACETAMINOPHEN 5-325 MG PO TABS: 1.0000 | ORAL_TABLET | Freq: Four times a day (QID) | ORAL | 0 refills | Status: DC | PRN

## 2021-09-08 NOTE — ED Provider Notes (Signed)
?[facility]-EMERGENCY DEPT ?Provider Note ? ? ?CSN: [id number] ?Arrival date & time: 09/08/21  1928 ? ?  ? ?History ? ?Chief Complaint  ?Patient presents with  ? Back Pain  ? ? ?Becky Vargas is a 51 y.o. female. ? ?HPI ?She presents for ongoing left posterior pelvis pain, for 3 weeks after a fall while at work.  She was on a short ladder and fell backwards, striking her lower back in the left pelvic region.  She was initially sore and had to lay down for 2 days but then was able to do more, until about 10 days ago when she has had increasing pain since.  She has pain with movement, standing and bending over.  She works as a Building surveyor.  No history of chronic back pain or prior hip or pelvis injuries. ? ?Home Medications ?Prior to Admission medications   ?Medication Sig Start Date End Date Taking? Authorizing Provider  ?oxyCODONE-acetaminophen (PERCOCET/ROXICET) 5-325 MG tablet Take 1 tablet by mouth every 6 (six) hours as needed for severe pain. 09/08/21  Yes Mancel Bale, MD  ?aspirin-acetaminophen-caffeine [facility] MIGRAINE) [phone] MG tablet Take 1 tablet by mouth every 6 (six) hours as needed for headache.    [provider]  ?benzonatate (TESSALON) 100 MG capsule Take 1 capsule (100 mg total) by mouth every 8 (eight) hours. 08/10/21   Prosperi, Christian H, PA-C  ?clonazePAM (KLONOPIN) 0.5 MG tablet Take 1 tablet (0.5 mg total) by mouth at bedtime. ?Patient not taking: Reported on 02/25/2018 01/03/17   Loletta Specter, PA-C  ?dicyclomine (BENTYL) 20 MG tablet Take 1 tablet (20 mg total) by mouth 3 (three) times daily as needed for spasms. ?Patient not taking: Reported on 02/25/2018 01/03/17   Loletta Specter, PA-C  ?dicyclomine (BENTYL) 20 MG tablet Take 1 tablet (20 mg total) by mouth 2 (two) times daily as needed for spasms (for spasm). 01/12/21   Arthor Captain, PA-C  ?doxycycline (VIBRA-TABS) 100 MG tablet Take 1 tablet (100 mg total) by mouth 2 (two) times daily.  01/06/20   Linwood Dibbles, MD  ?escitalopram (LEXAPRO) 10 MG tablet Take 1 tablet (10 mg total) by mouth daily. ?Patient not taking: Reported on 02/25/2018 01/03/17   Loletta Specter, PA-C  ?hydrochlorothiazide (HYDRODIURIL) 25 MG tablet Take 1 tablet (25 mg total) by mouth daily. 08/10/21   Prosperi, Christian H, PA-C  ?HYDROcodone-acetaminophen (NORCO/VICODIN) 5-325 MG tablet Take 1-2 tablets by mouth every 4 (four) hours as needed. ?Patient not taking: Reported on 01/06/2020 02/25/18   Aviva Kluver B, PA-C  ?lidocaine (XYLOCAINE) 2 % solution Use as directed 15 mLs in the mouth or throat as needed for mouth pain. 08/10/21   Prosperi, Ephriam Knuckles H, PA-C  ?   ? ?Allergies    ?Sulfa antibiotics   ? ?Review of Systems   ?Review of Systems ? ?Physical Exam ?Updated Vital Signs ?BP (!) 158/84   Pulse 71   Temp 98.2 ?F (36.8 ?C) (Oral)   Resp 16   LMP 11/17/2016 Comment: Upreg neg 12/26/16  SpO2 99%  ?Physical Exam ?Vitals and nursing note reviewed.  ?Constitutional:   ?   General: She is in acute distress (Uncomfortable).  ?   Appearance: She is well-developed. She is not ill-appearing.  ?HENT:  ?   Head: Normocephalic and atraumatic.  ?   Right Ear: External ear normal.  ?   Left Ear: External ear normal.  ?Eyes:  ?   Conjunctiva/sclera: Conjunctivae normal.  ?  Pupils: Pupils are equal, round, and reactive to light.  ?Neck:  ?   Trachea: Phonation normal.  ?Cardiovascular:  ?   Rate and Rhythm: Normal rate.  ?Pulmonary:  ?   Effort: Pulmonary effort is normal.  ?Chest:  ?   Chest wall: No tenderness.  ?Abdominal:  ?   General: There is no distension.  ?   Palpations: Abdomen is soft.  ?   Tenderness: There is no abdominal tenderness.  ?Musculoskeletal:  ?   Cervical back: Normal range of motion and neck supple.  ?   Comments: Tender left posterior pelvic region without deformity or crepitation.  Negative straight leg raising bilaterally.  She guards against standing and movement secondary to pain in the left posterior  pelvis region.  No leg length discrepancy.  ?Skin: ?   General: Skin is warm and dry.  ?Neurological:  ?   Mental Status: She is alert and oriented to person, place, and time.  ?   Cranial Nerves: No cranial nerve deficit.  ?   Sensory: No sensory deficit.  ?   Motor: No abnormal muscle tone.  ?   Coordination: Coordination normal.  ?Psychiatric:     ?   Mood and Affect: Mood normal.     ?   Behavior: Behavior normal.     ?   Thought Content: Thought content normal.     ?   Judgment: Judgment normal.  ? ? ?ED Results / Procedures / Treatments   ?Labs ?(all labs ordered are listed, but only abnormal results are displayed) ?Labs Reviewed - No data to display ? ?EKG ?None ? ?Radiology ?CT PELVIS WO CONTRAST ? ?Result Date: 09/08/2021 ?CLINICAL DATA:  Pelvic pain after a fall. Fell on 08/08/2021. Low back pain radiating to the left ribs for 10 days. EXAM: CT PELVIS WITHOUT CONTRAST TECHNIQUE: Multidetector CT imaging of the pelvis was performed following the standard protocol without intravenous contrast. RADIATION DOSE REDUCTION: This exam was performed according to the departmental dose-optimization program which includes automated exposure control, adjustment of the mA and/or kV according to patient size and/or use of iterative reconstruction technique. COMPARISON:  CT abdomen and pelvis 01/12/2021 FINDINGS: Urinary Tract:  No abnormality visualized. Bowel:  Unremarkable visualized pelvic bowel loops. Vascular/Lymphatic: No pathologically enlarged lymph nodes. No significant vascular abnormality seen. Reproductive:  No mass or other significant abnormality Other:  None. Musculoskeletal: The sacrum, pelvis, and hips appear intact. No acute displaced fractures are identified. SI joints and symphysis pubis are not displaced. No evidence of intramuscular hematoma or infiltration. IMPRESSION: 1. No evidence of acute fracture or dislocation involving the pelvis or hips. 2. No acute abnormalities identified. Electronically  Signed   By: Burman NievesWilliam  Stevens M.D.   On: 09/08/2021 20:39   ? ?Procedures ?Procedures  ? ? ?Medications Ordered in ED ?Medications  ?oxyCODONE-acetaminophen (PERCOCET/ROXICET) 5-325 MG per tablet 1 tablet (1 tablet Oral Given 09/08/21 2119)  ? ? ?ED Course/ Medical Decision Making/ A&P ?Clinical Course as of 09/08/21 2121  ?Sat Sep 08, 2021  ?2115 She would like to have something for pain now, and will have someone come and get her.  She requested a prescription for narcotic pain medicine. [EW]  ?  ?Clinical Course User Index ?[EW] Mancel BaleWentz, Michiel Sivley, MD  ? ?                        ?Medical Decision Making ?She is presenting for persistent pain for 3 weeks after fall from  a short ladder, where she landed on her left pelvic area.  She has had gradually worsening pain since that time.  Pain is worst with walking and movement.  No prior imaging has been done. ? ?Problems Addressed: ?Injury of pelvis, initial encounter: acute illness or injury ?   Details: Onset several weeks ago after a fall. ? ?Amount and/or Complexity of Data Reviewed ?Independent Historian:  ?   Details: She is a cogent historian ?Radiology: ordered and independent interpretation performed. ?   Details: CT pelvis-no acute injuries. ? ?Risk ?Prescription drug management. ?Decision regarding hospitalization. ?Risk Details: Patient with subacute injury to pelvis, presenting with persistent discomfort, worsening some over the last few days without additional trauma.  CT imaging does not show bony injury.  Suspect contusion versus radicular pain from the lumbar region.  No indication for further ED evaluation or hospitalization at this time.  Will treat symptomatically with rest and narcotic analgesia and referral to orthopedics. ? ? ? ? ? ? ? ? ? ? ?Final Clinical Impression(s) / ED Diagnoses ?Final diagnoses:  ?Injury of pelvis, initial encounter  ? ? ?Rx / DC Orders ?ED Discharge Orders   ? ?      Ordered  ?  oxyCODONE-acetaminophen (PERCOCET/ROXICET) 5-325  MG tablet  Every 6 hours PRN       ? 09/08/21 2117  ? ?  ?  ? ?  ? ? ?  ?Mancel Bale, MD ?09/08/21 2121 ? ?

## 2021-09-08 NOTE — ED Triage Notes (Signed)
Pt reports a fall at home on 3/1 where she fell and landed on a step stool. Has had increased mid lower back pain radiating to left hip / rib area x 10 days affecting mobility and getting in bed. Also experiencing pain in the mid upper back. Denies LOC, or any numbness.  ?

## 2021-09-08 NOTE — Discharge Instructions (Signed)
The CAT scan does not show any serious injuries.  Continue to use heat on the sore areas 3-4 times a day to help with the pain.  Do not drive or work, when using the narcotic pain reliever. ?

## 2021-11-18 ENCOUNTER — Encounter (HOSPITAL_COMMUNITY): Payer: Self-pay

## 2021-11-18 ENCOUNTER — Emergency Department (HOSPITAL_COMMUNITY)
Admission: EM | Admit: 2021-11-18 | Discharge: 2021-11-19 | Disposition: A | Discharge status: Home / Self Care | Payer: Medicaid Other | Attending: Emergency Medicine | Admitting: Emergency Medicine

## 2021-11-18 ENCOUNTER — Other Ambulatory Visit: Payer: Self-pay

## 2021-11-18 ENCOUNTER — Emergency Department (HOSPITAL_COMMUNITY): Payer: Medicaid Other

## 2021-11-18 DIAGNOSIS — I10 Essential (primary) hypertension: Secondary | ICD-10-CM | POA: Insufficient documentation

## 2021-11-18 DIAGNOSIS — Z79899 Other long term (current) drug therapy: Secondary | ICD-10-CM | POA: Insufficient documentation

## 2021-11-18 DIAGNOSIS — G4489 Other headache syndrome: Secondary | ICD-10-CM | POA: Insufficient documentation

## 2021-11-18 NOTE — ED Triage Notes (Signed)
Pt presents to ED from home, pt states she fell off of a hover board x  1 month ago and hit her head. Pt states she has been experiencing head pain since the incident, was not seen at the time fall occurred.

## 2021-11-18 NOTE — ED Provider Triage Note (Signed)
Emergency Medicine Provider Triage Evaluation Note  Becky Vargas , a 51 y.o. female  was evaluated in triage.  Pt complains of head injury onset 1 month ago.  Patient was using a hover board when she fell and hit the back of her head.  She notes that she had projectile vomiting the next day after that.  She was not evaluated for her symptoms.  Today she notes she is here for continued pain to the area.  Denies recent vomiting, blurred vision, double vision, recent injury, trauma, fall.  Review of Systems  Positive: As per HPI above Negative:   Physical Exam  BP (!) 154/103 (BP Location: Right Arm)   Pulse 66   Temp 98 F (36.7 C) (Oral)   Resp 20   Ht 4\' 11"  (1.499 m)   Wt 45.4 kg   LMP 11/13/2016 (Approximate)   SpO2 97%   BMI 20.20 kg/m  Gen:   Awake, no distress   Resp:  Normal effort  MSK:   Moves extremities without difficulty  Other:  TMs without acute findings bilaterally.  No acute contusion noted to head.  Medical Decision Making  Medically screening exam initiated at 11:13 PM.  Appropriate orders placed.  MADELEIN MAHADEO was informed that the remainder of the evaluation will be completed by another provider, this initial triage assessment does not replace that evaluation, and the importance of remaining in the ED until their evaluation is complete.  Work-up initiated   Alania Overholt A, PA-C 11/18/21 2314

## 2021-11-19 LAB — BASIC METABOLIC PANEL
Anion gap: 5 (ref 5–15)
BUN: 18 mg/dL (ref 6–20)
CO2: 27 mmol/L (ref 22–32)
Calcium: 8.3 mg/dL — ABNORMAL LOW (ref 8.9–10.3)
Chloride: 110 mmol/L (ref 98–111)
Creatinine, Ser: 0.75 mg/dL (ref 0.44–1.00)
GFR, Estimated: >60 mL/min (ref >60)
Glucose, Bld: 103 mg/dL — ABNORMAL HIGH (ref 70–99)
Potassium: 3.4 mmol/L — ABNORMAL LOW (ref 3.5–5.1)
Sodium: 142 mmol/L (ref 135–145)

## 2021-11-19 LAB — CBC
HCT: 34.9 % — ABNORMAL LOW (ref 36.0–46.0)
Hemoglobin: 11.8 g/dL — ABNORMAL LOW (ref 12.0–15.0)
MCH: 32.2 pg (ref 26.0–34.0)
MCHC: 33.8 g/dL (ref 30.0–36.0)
MCV: 95.4 fL (ref 80.0–100.0)
Platelets: 202 10*3/uL (ref 150–400)
RBC: 3.66 MIL/uL — ABNORMAL LOW (ref 3.87–5.11)
RDW: 13.4 % (ref 11.5–15.5)
WBC: 4.7 10*3/uL (ref 4.0–10.5)
nRBC: 0 % (ref 0.0–0.2)

## 2021-11-19 LAB — SEDIMENTATION RATE: Sed Rate: 7 mm/hr (ref 0–22)

## 2021-11-19 MED ORDER — IBUPROFEN 200 MG PO TABS
400.0000 mg | ORAL_TABLET | Freq: Once | ORAL | Status: AC
  Administered 2021-11-19: 400 mg via ORAL
  Filled 2021-11-19: qty 2

## 2021-11-19 MED ORDER — ONDANSETRON 8 MG PO TBDP
8.0000 mg | ORAL_TABLET | Freq: Once | ORAL | Status: DC
  Filled 2021-11-19: qty 1

## 2021-11-19 NOTE — ED Provider Notes (Signed)
[facility]   CSN: [id number] Arrival date & time: 11/18/21  2228     History  Chief Complaint  Patient presents with   Head Injury    Becky Vargas is a 51 y.o. female.  The history is provided by the patient.  Head Injury Location:  R parietal and R temporal Pain details:    Severity:  Moderate   Timing:  Constant   Progression:  Worsening Chronicity:  Recurrent Relieved by:  Nothing Associated symptoms: headache and neck pain   Associated symptoms: no blurred vision, no disorientation, no double vision and no vomiting   Patient presents for headache 1 month after head injury Patient reports she fell off a hover board last month.  She fell hitting the back of her head.  Since that time she has had mild headache mostly on the right side of her head.  Tonight she reports the headache worsened.  No vomiting tonight.  No new vision changes.  No focal weakness.  She reports some pain into her neck as well No previous history of CVA No cough, no fever, no facial pain   Past Medical History:  Diagnosis Date   Colitis    Endometriosis    Hypertension    Irritable bowel syndrome (IBS)    SVT (supraventricular tachycardia) (HCC)     Home Medications Prior to Admission medications   Medication Sig Start Date End Date Taking? Authorizing Provider  aspirin-acetaminophen-caffeine (EXCEDRIN MIGRAINE) [phone] MG tablet Take 1 tablet by mouth every 6 (six) hours as needed for headache.    [provider]  doxycycline (VIBRA-TABS) 100 MG tablet Take 1 tablet (100 mg total) by mouth 2 (two) times daily. 01/06/20   Linwood DibblesKnapp, Jon, MD  escitalopram (LEXAPRO) 10 MG tablet Take 1 tablet (10 mg total) by mouth daily. Patient not taking: Reported on 02/25/2018 01/03/17   Loletta SpecterGomez, Roger David, PA-C  hydrochlorothiazide (HYDRODIURIL) 25 MG tablet Take 1 tablet (25 mg total) by mouth daily. 08/10/21   Prosperi, Christian H, PA-C  lidocaine  (XYLOCAINE) 2 % solution Use as directed 15 mLs in the mouth or throat as needed for mouth pain. 08/10/21   Prosperi, Christian H, PA-C  oxyCODONE-acetaminophen (PERCOCET/ROXICET) 5-325 MG tablet Take 1 tablet by mouth every 6 (six) hours as needed for severe pain. 09/08/21   Mancel BaleWentz, Elliott, MD      Allergies    Sulfa antibiotics    Review of Systems   Review of Systems  Eyes:  Negative for blurred vision and double vision.  Gastrointestinal:  Negative for vomiting.  Musculoskeletal:  Positive for neck pain.  Neurological:  Positive for headaches.    Physical Exam Updated Vital Signs BP (!) 154/103 (BP Location: Right Arm)   Pulse 66   Temp 98 F (36.7 C) (Oral)   Resp 20   Ht 1.499 m (4\' 11" )   Wt 45.4 kg   LMP 11/13/2016 (Approximate)   SpO2 97%   BMI 20.20 kg/m  Physical Exam CONSTITUTIONAL: Well developed/well nourished HEAD: Normocephalic/atraumatic Mild tenderness noted to the right parietal scalp, no bruising or crepitus EYES: EOMI/PERRL, no nystagmus, no ptosis ENMT: Mucous membranes moist NECK: supple no meningeal signs, no bruits CV: S1/S2 noted, no murmurs/rubs/gallops noted LUNGS: Lungs are clear to auscultation bilaterally, no apparent distress ABDOMEN: soft, nontender, no rebound or guarding GU:no cva tenderness NEURO:Awake/alert, face symmetric, no arm or leg drift is noted Equal 5/5 strength with shoulder abduction, elbow flex/extension, wrist flex/extension in  upper extremities  Equal 5/5 strength with hip flexion,knee flex/extension, foot dorsi/plantar flexion Cranial nerves 3/4/5/6/12/16/08/11/12 tested and intact Gait normal without ataxia No past pointing Sensation to light touch intact in all extremities EXTREMITIES: pulses normal, full ROM SKIN: warm, color normal PSYCH: no abnormalities of mood noted  ED Results / Procedures / Treatments   Labs (all labs ordered are listed, but only abnormal results are displayed) Labs Reviewed  CBC - Abnormal;  Notable for the following components:      Result Value   RBC 3.66 (*)    Hemoglobin 11.8 (*)    HCT 34.9 (*)    All other components within normal limits  BASIC METABOLIC PANEL - Abnormal; Notable for the following components:   Potassium 3.4 (*)    Glucose, Bld 103 (*)    Calcium 8.3 (*)    All other components within normal limits  SEDIMENTATION RATE    EKG None  Radiology CT Head Wo Contrast  Result Date: 11/18/2021 CLINICAL DATA:  Worsening headache. EXAM: CT HEAD WITHOUT CONTRAST TECHNIQUE: Contiguous axial images were obtained from the base of the skull through the vertex without intravenous contrast. RADIATION DOSE REDUCTION: This exam was performed according to the departmental dose-optimization program which includes automated exposure control, adjustment of the mA and/or kV according to patient size and/or use of iterative reconstruction technique. COMPARISON:  None Available. FINDINGS: Brain: No evidence of acute infarction, hemorrhage, hydrocephalus, extra-axial collection or mass lesion/mass effect. Vascular: No hyperdense vessel or unexpected calcification. Skull: Normal. Negative for fracture or focal lesion. Sinuses/Orbits: There is mild left maxillary sinus mucosal thickening. Other: None. IMPRESSION: 1. No acute intracranial abnormality. 2. Mild left maxillary sinus disease. Electronically Signed   By: Aram Candela M.D.   On: 11/18/2021 23:38    Procedures Procedures    Medications Ordered in ED Medications  ondansetron (ZOFRAN-ODT) disintegrating tablet 8 mg (8 mg Oral Not Given 11/19/21 0242)  ibuprofen (ADVIL) tablet 400 mg (400 mg Oral Given 11/19/21 0229)    ED Course/ Medical Decision Making/ A&P Clinical Course as of 11/19/21 0616  Mon Nov 19, 2021  0306 Patient presents with ongoing headache 1 month after head injury.  No acute traumatic findings on CT head.  Patient keeps pointing to the temporal region as a source of pain but denies any visual  changes.  Screening labs will be obtained.  No signs of focal weakness [DW]  0515 Patient reports she is unable to wait any longer.  Sed rate is still pending.  Phone number was given I confirmed her pharmacy in case any prescriptions need to be sent in.  Due to the location of her pain, it was reasonable to rule out temporal arteritis [DW]    Clinical Course User Index [DW] Zadie Rhine, MD                           Medical Decision Making Amount and/or Complexity of Data Reviewed Labs: ordered.  Risk OTC drugs. Prescription drug management.   This patient presents to the ED for concern of headache after fall, this involves an extensive number of treatment options, and is a complaint that carries with it a high risk of complications and morbidity.  The differential diagnosis includes but is not limited to skull fracture, intracranial hemorrhage, subdural hematoma, concussion, migraine, temporal arteritis, subarachnoid hemorrhage  Comorbidities that complicate the patient evaluation: Patient's presentation is complicated by their history of hypertension and SVT  Lab Tests: I Ordered, and personally interpreted labs.  The pertinent results include:  mild anemia  Imaging Studies ordered: I ordered imaging studies including CT scan head   I independently visualized and interpreted imaging which showed no acute findings I agree with the radiologist interpretation  Medicines ordered and prescription drug management: I ordered medication including ibuprofen  for HA  Reevaluation of the patient after these medicines showed that the patient    improved  Reevaluation: After the interventions noted above, I reevaluated the patient and found that they have :improved  Complexity of problems addressed: Patient's presentation is most consistent with  acute presentation with potential threat to life or bodily function  Disposition: After consideration of the diagnostic results and  the patient's response to treatment,  I feel that the patent would benefit from discharge   .   Sed rate negative - less likely temporal arteritis        Final Clinical Impression(s) / ED Diagnoses Final diagnoses:  Other headache syndrome    Rx / DC Orders ED Discharge Orders     None         Zadie Rhine, MD 11/19/21 [phone]

## 2021-11-19 NOTE — Discharge Instructions (Signed)

## 2021-12-26 ENCOUNTER — Encounter (HOSPITAL_COMMUNITY): Payer: Self-pay

## 2021-12-26 ENCOUNTER — Emergency Department (HOSPITAL_COMMUNITY)
Admission: EM | Admit: 2021-12-26 | Discharge: 2021-12-26 | Disposition: A | Discharge status: Home / Self Care | Payer: Medicaid Other | Attending: Emergency Medicine | Admitting: Emergency Medicine

## 2021-12-26 ENCOUNTER — Other Ambulatory Visit: Payer: Self-pay

## 2021-12-26 DIAGNOSIS — I10 Essential (primary) hypertension: Secondary | ICD-10-CM | POA: Insufficient documentation

## 2021-12-26 DIAGNOSIS — Z79899 Other long term (current) drug therapy: Secondary | ICD-10-CM | POA: Insufficient documentation

## 2021-12-26 LAB — COMPREHENSIVE METABOLIC PANEL
ALT: 14 U/L (ref 0–44)
AST: 23 U/L (ref 15–41)
Albumin: 4.1 g/dL (ref 3.5–5.0)
Alkaline Phosphatase: 62 U/L (ref 38–126)
Anion gap: 7 (ref 5–15)
BUN: 14 mg/dL (ref 6–20)
CO2: 27 mmol/L (ref 22–32)
Calcium: 8.9 mg/dL (ref 8.9–10.3)
Chloride: 101 mmol/L (ref 98–111)
Creatinine, Ser: 0.84 mg/dL (ref 0.44–1.00)
GFR, Estimated: >60 mL/min (ref >60)
Glucose, Bld: 81 mg/dL (ref 70–99)
Potassium: 3.3 mmol/L — ABNORMAL LOW (ref 3.5–5.1)
Sodium: 135 mmol/L (ref 135–145)
Total Bilirubin: 0.5 mg/dL (ref 0.3–1.2)
Total Protein: 7.2 g/dL (ref 6.5–8.1)

## 2021-12-26 LAB — CBC WITH DIFFERENTIAL/PLATELET
Abs Immature Granulocytes: 0.01 10*3/uL (ref 0.00–0.07)
Basophils Absolute: 0 10*3/uL (ref 0.0–0.1)
Basophils Relative: 1 %
Eosinophils Absolute: 0 10*3/uL (ref 0.0–0.5)
Eosinophils Relative: 1 %
HCT: 36.1 % (ref 36.0–46.0)
Hemoglobin: 12.4 g/dL (ref 12.0–15.0)
Immature Granulocytes: 0 %
Lymphocytes Relative: 49 %
Lymphs Abs: 1.8 10*3/uL (ref 0.7–4.0)
MCH: 32.4 pg (ref 26.0–34.0)
MCHC: 34.3 g/dL (ref 30.0–36.0)
MCV: 94.3 fL (ref 80.0–100.0)
Monocytes Absolute: 0.3 10*3/uL (ref 0.1–1.0)
Monocytes Relative: 8 %
Neutro Abs: 1.5 10*3/uL — ABNORMAL LOW (ref 1.7–7.7)
Neutrophils Relative %: 41 %
Platelets: 236 10*3/uL (ref 150–400)
RBC: 3.83 MIL/uL — ABNORMAL LOW (ref 3.87–5.11)
RDW: 13.2 % (ref 11.5–15.5)
WBC: 3.6 10*3/uL — ABNORMAL LOW (ref 4.0–10.5)
nRBC: 0 % (ref 0.0–0.2)

## 2021-12-26 LAB — URINALYSIS, ROUTINE W REFLEX MICROSCOPIC
Bacteria, UA: NONE SEEN
Bilirubin Urine: NEGATIVE
Glucose, UA: NEGATIVE mg/dL
Ketones, ur: NEGATIVE mg/dL
Nitrite: NEGATIVE
Protein, ur: NEGATIVE mg/dL
Specific Gravity, Urine: 1.023 (ref 1.005–1.030)
pH: 6 (ref 5.0–8.0)

## 2021-12-26 MED ORDER — METOPROLOL TARTRATE 25 MG PO TABS
50.0000 mg | ORAL_TABLET | Freq: Once | ORAL | Status: AC
  Administered 2021-12-26: 50 mg via ORAL
  Filled 2021-12-26: qty 2

## 2021-12-26 MED ORDER — POTASSIUM CHLORIDE CRYS ER 20 MEQ PO TBCR
40.0000 meq | EXTENDED_RELEASE_TABLET | Freq: Once | ORAL | Status: AC
  Administered 2021-12-26: 40 meq via ORAL
  Filled 2021-12-26: qty 2

## 2021-12-26 MED ORDER — HYDRALAZINE HCL 100 MG PO TABS: 50.0000 mg | ORAL_TABLET | Freq: Two times a day (BID) | ORAL | 0 refills | Status: DC

## 2021-12-26 MED ORDER — LOSARTAN POTASSIUM 25 MG PO TABS
50.0000 mg | ORAL_TABLET | Freq: Once | ORAL | Status: AC
  Administered 2021-12-26: 50 mg via ORAL
  Filled 2021-12-26: qty 2

## 2021-12-26 MED ORDER — HYDRALAZINE HCL 25 MG PO TABS
50.0000 mg | ORAL_TABLET | Freq: Once | ORAL | Status: AC
  Administered 2021-12-26: 50 mg via ORAL
  Filled 2021-12-26: qty 2

## 2021-12-26 MED ORDER — METOPROLOL TARTRATE 50 MG PO TABS: 50.0000 mg | ORAL_TABLET | Freq: Two times a day (BID) | ORAL | 0 refills | Status: DC

## 2021-12-26 NOTE — ED Triage Notes (Signed)
Pt states she was seen at the dentist on Monday and states her BP was elevated. She states she is here now to get started back on some BP meds so she can continue her dental work. Doesn't currently have a PCP. Pt also reports strong odor to her urine.

## 2021-12-26 NOTE — ED Provider Triage Note (Signed)
Emergency Medicine Provider Triage Evaluation Note  Becky Vargas , a 51 y.o. female  was evaluated in triage.  Pt complains of elevated blood pressure.  Had dental work on Monday, they told her that her blood pressure was too high in order for them to do any type of work.  She is complaining of pain to her teeth after having them drill on.  She does get her prescription for blood pressure medication in the emergency department, was being followed by the healthcare department however reports she cannot wait for this.  Review of Systems  Positive: Dental problem Negative: Fever, cp,sob  Physical Exam  BP (!) 191/103 (BP Location: Right Arm) Comment: pt stated she out of her bloodpressure medication  Pulse 79   Temp 98.1 F (36.7 C) (Oral)   Resp 18   Ht 4\' 11"  (1.499 m)   Wt 45.4 kg   LMP 11/13/2016 (Approximate)   SpO2 100%   BMI 20.20 kg/m  Gen:   Awake, no distress   Resp:  Normal effort  MSK:   Moves extremities without difficulty  Other:    Medical Decision Making  Medically screening exam initiated at 2:08 PM.  Appropriate orders placed.  Becky Vargas was informed that the remainder of the evaluation will be completed by another provider, this initial triage assessment does not replace that evaluation, and the importance of remaining in the ED until their evaluation is complete.     Irene Limbo, PA-C 12/26/21 1412

## 2021-12-26 NOTE — Discharge Instructions (Signed)
Follow-up with your primary care doctor or cardiologist this week to make an appointment for your high blood pressure.  Take the medications as written.  If you develop new onset headaches or fevers or chest pain or any additional concerns, return back to the ER.

## 2021-12-27 ENCOUNTER — Telehealth (HOSPITAL_COMMUNITY): Payer: Self-pay | Admitting: Emergency Medicine

## 2021-12-27 MED ORDER — HYDRALAZINE HCL 100 MG PO TABS: 50.0000 mg | ORAL_TABLET | Freq: Two times a day (BID) | ORAL | 0 refills | Status: DC

## 2021-12-27 NOTE — Telephone Encounter (Signed)
Needs prescription sent to different pharmacy

## 2021-12-28 ENCOUNTER — Emergency Department (HOSPITAL_COMMUNITY)
Admission: EM | Admit: 2021-12-28 | Discharge: 2021-12-28 | Disposition: A | Discharge status: Home / Self Care | Payer: Medicaid Other | Attending: Emergency Medicine | Admitting: Emergency Medicine

## 2021-12-28 ENCOUNTER — Other Ambulatory Visit: Payer: Self-pay

## 2021-12-28 DIAGNOSIS — R519 Headache, unspecified: Secondary | ICD-10-CM | POA: Insufficient documentation

## 2021-12-28 DIAGNOSIS — Z79899 Other long term (current) drug therapy: Secondary | ICD-10-CM | POA: Insufficient documentation

## 2021-12-28 DIAGNOSIS — R002 Palpitations: Secondary | ICD-10-CM | POA: Insufficient documentation

## 2021-12-28 DIAGNOSIS — Z7982 Long term (current) use of aspirin: Secondary | ICD-10-CM | POA: Insufficient documentation

## 2021-12-28 DIAGNOSIS — E876 Hypokalemia: Secondary | ICD-10-CM | POA: Insufficient documentation

## 2021-12-28 DIAGNOSIS — I10 Essential (primary) hypertension: Secondary | ICD-10-CM | POA: Insufficient documentation

## 2021-12-28 DIAGNOSIS — N9489 Other specified conditions associated with female genital organs and menstrual cycle: Secondary | ICD-10-CM | POA: Insufficient documentation

## 2021-12-28 LAB — URINALYSIS, ROUTINE W REFLEX MICROSCOPIC
Bilirubin Urine: NEGATIVE
Glucose, UA: NEGATIVE mg/dL
Hgb urine dipstick: NEGATIVE
Ketones, ur: 20 mg/dL — AB
Leukocytes,Ua: NEGATIVE
Nitrite: NEGATIVE
Protein, ur: NEGATIVE mg/dL
Specific Gravity, Urine: 1.02 (ref 1.005–1.030)
pH: 5 (ref 5.0–8.0)

## 2021-12-28 LAB — BASIC METABOLIC PANEL
Anion gap: 10 (ref 5–15)
BUN: 17 mg/dL (ref 6–20)
CO2: 21 mmol/L — ABNORMAL LOW (ref 22–32)
Calcium: 9.2 mg/dL (ref 8.9–10.3)
Chloride: 105 mmol/L (ref 98–111)
Creatinine, Ser: 0.66 mg/dL (ref 0.44–1.00)
GFR, Estimated: >60 mL/min (ref >60)
Glucose, Bld: 110 mg/dL — ABNORMAL HIGH (ref 70–99)
Potassium: 3.3 mmol/L — ABNORMAL LOW (ref 3.5–5.1)
Sodium: 136 mmol/L (ref 135–145)

## 2021-12-28 LAB — I-STAT BETA HCG BLOOD, ED (MC, WL, AP ONLY): I-stat hCG, quantitative: <5 m[IU]/mL (ref <5)

## 2021-12-28 LAB — CBC
HCT: 39.6 % (ref 36.0–46.0)
Hemoglobin: 13.8 g/dL (ref 12.0–15.0)
MCH: 32.2 pg (ref 26.0–34.0)
MCHC: 34.8 g/dL (ref 30.0–36.0)
MCV: 92.3 fL (ref 80.0–100.0)
Platelets: 262 10*3/uL (ref 150–400)
RBC: 4.29 MIL/uL (ref 3.87–5.11)
RDW: 13 % (ref 11.5–15.5)
WBC: 5.6 10*3/uL (ref 4.0–10.5)
nRBC: 0 % (ref 0.0–0.2)

## 2021-12-28 LAB — CBG MONITORING, ED: Glucose-Capillary: 118 mg/dL — ABNORMAL HIGH (ref 70–99)

## 2021-12-28 MED ORDER — PROCHLORPERAZINE MALEATE 10 MG PO TABS
10.0000 mg | ORAL_TABLET | Freq: Once | ORAL | Status: AC
  Administered 2021-12-28: 10 mg via ORAL
  Filled 2021-12-28: qty 1

## 2021-12-28 MED ORDER — POTASSIUM CHLORIDE CRYS ER 20 MEQ PO TBCR
40.0000 meq | EXTENDED_RELEASE_TABLET | Freq: Once | ORAL | Status: AC
  Administered 2021-12-28: 40 meq via ORAL
  Filled 2021-12-28: qty 2

## 2021-12-28 MED ORDER — METOCLOPRAMIDE HCL 10 MG PO TABS
10.0000 mg | ORAL_TABLET | Freq: Once | ORAL | Status: AC
  Administered 2021-12-28: 10 mg via ORAL
  Filled 2021-12-28: qty 1

## 2021-12-28 NOTE — ED Provider Notes (Signed)
[facility]   CSN: [id number] Arrival date & time: 12/28/21  0023     History  Chief Complaint  Patient presents with   Hypertension   Weakness    Becky Vargas is a 51 y.o. female.  Patient was brought in by EMS for but was initially in evaluation of weakness.  Patient states that she was seen at the same emergency department on Wednesday for hypertension after a dentist would not do a procedure due to her blood pressure.  She was given metoprolol and hydralazine prescriptions.  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  She was worried that her medications may have made her heart beat too fast.  She states that the feeling of palpitations lasted for about an hour.  Those feelings dissipated while in the waiting room.  She is not currently complaining of weakness, but is concerned about the palpitations last night.  Patient also states that she began to have a right-sided headache this morning.  She does have history of migraines and states this feels similar to her previous migraines.  She states that in the past she has taken Excedrin Migraine at home with great relief.  Past medical history significant for hypertension, history of SVT, IBS  HPI     Home Medications Prior to Admission medications   Medication Sig Start Date End Date Taking? Authorizing Provider  aspirin-acetaminophen-caffeine (EXCEDRIN MIGRAINE) [phone] MG tablet Take 1 tablet by mouth every 6 (six) hours as needed for headache.    [provider]  doxycycline (VIBRA-TABS) 100 MG tablet Take 1 tablet (100 mg total) by mouth 2 (two) times daily. 01/06/20   Linwood Dibbles, MD  escitalopram (LEXAPRO) 10 MG tablet Take 1 tablet (10 mg total) by mouth daily. Patient not taking: Reported on 02/25/2018 01/03/17   Loletta Specter, PA-C  hydrALAZINE (APRESOLINE) 100 MG tablet Take 0.5 tablets (50 mg total) by mouth  2 (two) times daily. 12/27/21 01/26/22  Terrilee Files, MD  hydrochlorothiazide (HYDRODIURIL) 25 MG tablet Take 1 tablet (25 mg total) by mouth daily. 08/10/21   Prosperi, Christian H, PA-C  lidocaine (XYLOCAINE) 2 % solution Use as directed 15 mLs in the mouth or throat as needed for mouth pain. 08/10/21   Prosperi, Christian H, PA-C  metoprolol tartrate (LOPRESSOR) 50 MG tablet Take 1 tablet (50 mg total) by mouth 2 (two) times daily. 12/26/21 01/25/22  Cheryll Cockayne, MD  oxyCODONE-acetaminophen (PERCOCET/ROXICET) 5-325 MG tablet Take 1 tablet by mouth every 6 (six) hours as needed for severe pain. 09/08/21   Mancel Bale, MD      Allergies    Sulfa antibiotics    Review of Systems   Review of Systems  Constitutional:  Negative for fever.  Respiratory:  Negative for shortness of breath.   Cardiovascular:  Positive for palpitations (Resolved). Negative for chest pain.  Gastrointestinal:  Negative for abdominal pain, constipation, diarrhea, nausea and vomiting.  Neurological:  Positive for headaches.    Physical Exam Updated Vital Signs BP (!) 163/90   Pulse 82   Temp 98 F (36.7 C) (Oral)   Resp 17   Ht 4\' 11"  (1.499 m)   Wt 45.4 kg   LMP 11/13/2016 (Approximate)   SpO2 100%   BMI 20.20 kg/m  Physical Exam Vitals and nursing note reviewed.  Constitutional:      General: She is not in acute distress. HENT:  Head: Normocephalic and atraumatic.     Mouth/Throat:     Mouth: Mucous membranes are moist.  Eyes:     Conjunctiva/sclera: Conjunctivae normal.  Cardiovascular:     Rate and Rhythm: Normal rate and regular rhythm.     Pulses: Normal pulses.     Heart sounds: Normal heart sounds.  Pulmonary:     Effort: Pulmonary effort is normal.     Breath sounds: Normal breath sounds.  Abdominal:     Palpations: Abdomen is soft.     Tenderness: There is no abdominal tenderness.  Musculoskeletal:        General: Normal range of motion.     Cervical back: Normal range of  motion and neck supple.  Skin:    General: Skin is warm and dry.  Neurological:     Mental Status: She is alert and oriented to person, place, and time.     Cranial Nerves: No cranial nerve deficit.  Psychiatric:        Behavior: Behavior normal.     ED Results / Procedures / Treatments   Labs (all labs ordered are listed, but only abnormal results are displayed) Labs Reviewed  BASIC METABOLIC PANEL - Abnormal; Notable for the following components:      Result Value   Potassium 3.3 (*)    CO2 21 (*)    Glucose, Bld 110 (*)    All other components within normal limits  URINALYSIS, ROUTINE W REFLEX MICROSCOPIC - Abnormal; Notable for the following components:   Ketones, ur 20 (*)    All other components within normal limits  CBG MONITORING, ED - Abnormal; Notable for the following components:   Glucose-Capillary 118 (*)    All other components within normal limits  CBC  I-STAT BETA HCG BLOOD, ED ([facility], [facility], [facility] ONLY)    EKG None  Radiology No results found.  Procedures Procedures    Medications Ordered in ED Medications  metoCLOPramide (REGLAN) tablet 10 mg (10 mg Oral Given 12/28/21 0734)  prochlorperazine (COMPAZINE) tablet 10 mg (10 mg Oral Given 12/28/21 0734)  potassium chloride SA (KLOR-CON M) CR tablet 40 mEq (40 mEq Oral Given 12/28/21 9735)    ED Course/ Medical Decision Making/ A&P                           Medical Decision Making Risk Prescription drug management.   Patient presents with a chief complaint of palpitations.  Differential could include anxiety, dysrhythmia, pulmonary embolism, and others  I reviewed the patient's history including chart review.  I reviewed charts from July 19 showing her visit for hypertension where she was prescribed Toprol and hydralazine.  She was also given potassium for mild hypokalemia  I ordered and reviewed labs.  Pertinent results include potassium 3.3.  Grossly normal CBC.  Urinalysis not indicative of  infection  I ordered and reviewed an EKG which showed an underlying rhythm of sinus rhythm.  I have very very low clinical suspicion of a pulmonary embolism since the patient has no shortness of breath and no tachycardia at this time.  She also has no complaints of chest pain.  The patient may have had an episode of tachycardia but has had no episodes while here at the hospital.  I cannot rule out an episode of tachycardia due to a dysrhythmia.  Based on patient's description of events this could also be anxiety related.  The patient currently sees Dr. Algie Coffer of cardiology  due to a history of SVT in the past.  I see no indication at this time to admit the patient.  I believe she may follow-up outpatient with his clinic for further evaluation.  Return precautions provided including chest pain or shortness of breath.  The patient was given Reglan and Compazine for headache.  Upon reassessment she states that they minimally improved her headache.  She states this feels similar to previous migraines and that Excedrin Migraine works for her.  She plans to take Excedrin Migraine upon discharge home.  Discharge home with return precautions        Final Clinical Impression(s) / ED Diagnoses Final diagnoses:  Palpitations  Nonintractable headache, unspecified chronicity pattern, unspecified headache type  Hypokalemia    Rx / DC Orders ED Discharge Orders     None         Pamala Duffel 12/28/21 0908    Cathren Laine, MD 12/28/21 1038

## 2021-12-28 NOTE — Discharge Instructions (Signed)
You were seen today due to a feeling of a racing heartbeat.  While in the emergency department your heart rate remained within normal levels.  Your blood pressure is elevated but not to an emergent level.  Please continue to take the medications prescribed at your previous visit.  Please follow-up as discussed with the health department or primary care for management of your hypertension.  Please follow-up with cardiology to check on further interventions and evaluation for your heart rate.

## 2021-12-28 NOTE — ED Triage Notes (Signed)
Patient BIB EMS for evaluation of generalized weakness.  Reports she was seen here yesterday for HTN after seeing dentist and informed she needed to be treated for HTN.  Given Metoprolol and Hydralazine.  C/o generalized weakness, nausea, vomiting, and "my heart is being fast."

## 2022-01-01 NOTE — ED Provider Notes (Signed)
[facility] DEPT Provider Note   CSN: [id number] Arrival date & time: 12/26/21  1338     History  Chief Complaint  Patient presents with   Hypertension    Becky Vargas is a 51 y.o. female.  Patient presents ER chief complaint of elevated blood pressure.  She states that she went to get her dental work done and her dentist checked her blood pressure, found that it was elevated and told her to go get it treated.  She primary care doctor was unable to get an appointment for her so she presents to the ER.  Otherwise denies any headache denies chest pain denies pain denies fevers cough vomiting or diarrhea.       Home Medications Prior to Admission medications   Medication Sig Start Date End Date Taking? Authorizing Provider  metoprolol tartrate (LOPRESSOR) 50 MG tablet Take 1 tablet (50 mg total) by mouth 2 (two) times daily. 12/26/21 01/25/22 Yes Cheryll Cockayne, MD  aspirin-acetaminophen-caffeine (EXCEDRIN MIGRAINE) [phone] MG tablet Take 1 tablet by mouth every 6 (six) hours as needed for headache.    [provider]  doxycycline (VIBRA-TABS) 100 MG tablet Take 1 tablet (100 mg total) by mouth 2 (two) times daily. 01/06/20   Linwood Dibbles, MD  escitalopram (LEXAPRO) 10 MG tablet Take 1 tablet (10 mg total) by mouth daily. Patient not taking: Reported on 02/25/2018 01/03/17   Loletta Specter, PA-C  hydrALAZINE (APRESOLINE) 100 MG tablet Take 0.5 tablets (50 mg total) by mouth 2 (two) times daily. 12/27/21 01/26/22  Terrilee Files, MD  hydrochlorothiazide (HYDRODIURIL) 25 MG tablet Take 1 tablet (25 mg total) by mouth daily. 08/10/21   Prosperi, Christian H, PA-C  lidocaine (XYLOCAINE) 2 % solution Use as directed 15 mLs in the mouth or throat as needed for mouth pain. 08/10/21   Prosperi, Christian H, PA-C  oxyCODONE-acetaminophen (PERCOCET/ROXICET) 5-325 MG tablet Take 1 tablet by mouth every 6 (six) hours as needed for severe pain. 09/08/21    Mancel Bale, MD      Allergies    Sulfa antibiotics    Review of Systems   Review of Systems  Constitutional:  Negative for fever.  HENT:  Negative for ear pain.   Eyes:  Negative for pain.  Respiratory:  Negative for cough.   Cardiovascular:  Negative for chest pain.  Gastrointestinal:  Negative for abdominal pain.  Genitourinary:  Negative for flank pain.  Musculoskeletal:  Negative for back pain.  Skin:  Negative for rash.  Neurological:  Negative for headaches.    Physical Exam Updated Vital Signs BP (!) 150/83   Pulse 71   Temp 97.8 F (36.6 C) (Oral)   Resp 20   Ht 4\' 11"  (1.499 m)   Wt 45.4 kg   LMP 11/13/2016 (Approximate)   SpO2 99%   BMI 20.20 kg/m  Physical Exam Constitutional:      General: She is not in acute distress.    Appearance: Normal appearance.  HENT:     Head: Normocephalic.     Nose: Nose normal.  Eyes:     Extraocular Movements: Extraocular movements intact.  Cardiovascular:     Rate and Rhythm: Normal rate.  Pulmonary:     Effort: Pulmonary effort is normal.  Abdominal:     General: There is no distension.     Tenderness: There is no abdominal tenderness. There is no guarding.  Musculoskeletal:        General: Normal range of  motion.     Cervical back: Normal range of motion.  Neurological:     General: No focal deficit present.     Mental Status: She is alert and oriented to person, place, and time. Mental status is at baseline.     Cranial Nerves: No cranial nerve deficit.     Motor: No weakness.     Gait: Gait normal.     ED Results / Procedures / Treatments   Labs (all labs ordered are listed, but only abnormal results are displayed) Labs Reviewed  URINALYSIS, ROUTINE W REFLEX MICROSCOPIC - Abnormal; Notable for the following components:      Result Value   APPearance HAZY (*)    Hgb urine dipstick SMALL (*)    Leukocytes,Ua TRACE (*)    All other components within normal limits  CBC WITH DIFFERENTIAL/PLATELET -  Abnormal; Notable for the following components:   WBC 3.6 (*)    RBC 3.83 (*)    Neutro Abs 1.5 (*)    All other components within normal limits  COMPREHENSIVE METABOLIC PANEL - Abnormal; Notable for the following components:   Potassium 3.3 (*)    All other components within normal limits    EKG None  Radiology No results found.  Procedures Procedures    Medications Ordered in ED Medications  potassium chloride SA (KLOR-CON M) CR tablet 40 mEq (40 mEq Oral Given 12/26/21 1856)  metoprolol tartrate (LOPRESSOR) tablet 50 mg (50 mg Oral Given 12/26/21 1855)  losartan (COZAAR) tablet 50 mg (50 mg Oral Given 12/26/21 1954)  hydrALAZINE (APRESOLINE) tablet 50 mg (50 mg Oral Given 12/26/21 2057)    ED Course/ Medical Decision Making/ A&P                           Medical Decision Making Amount and/or Complexity of Data Reviewed Labs: ordered.  Risk Prescription drug management.   Review of records shows prior visit in April 2023 and discharged home.  Cardiac monitoring showing sinus rhythm.  Has been sent potassium 3.3 white count 3 hemoglobin 12 chemistry as mentioned.  Urinalysis unremarkable.  Patient given metoprolol potassium Cozaar and hydralazine with improvement of blood pressure.  Recommending close monitoring with her primary care doctor for further BP concerns.  Advising immediate return if patient develops chest pain trouble breathing or any additional concerns return to [facility] back to the ER.        Final Clinical Impression(s) / ED Diagnoses Final diagnoses:  Hypertension, unspecified type    Rx / DC Orders ED Discharge Orders          Ordered    metoprolol tartrate (LOPRESSOR) 50 MG tablet  2 times daily        12/26/21 2248    hydrALAZINE (APRESOLINE) 100 MG tablet  2 times daily,   Status:  Discontinued        12/26/21 2248              Cheryll Cockayne, MD 01/01/22 1605

## 2023-01-05 ENCOUNTER — Other Ambulatory Visit (HOSPITAL_COMMUNITY): Payer: Self-pay

## 2023-01-05 ENCOUNTER — Emergency Department (HOSPITAL_COMMUNITY): Payer: BLUE CROSS/BLUE SHIELD

## 2023-01-05 ENCOUNTER — Emergency Department (HOSPITAL_COMMUNITY)
Admission: EM | Admit: 2023-01-05 | Discharge: 2023-01-05 | Disposition: A | Discharge status: Home / Self Care | Payer: BLUE CROSS/BLUE SHIELD | Attending: Emergency Medicine | Admitting: Emergency Medicine

## 2023-01-05 ENCOUNTER — Other Ambulatory Visit: Payer: Self-pay

## 2023-01-05 ENCOUNTER — Encounter (HOSPITAL_COMMUNITY): Payer: Self-pay

## 2023-01-05 DIAGNOSIS — R079 Chest pain, unspecified: Secondary | ICD-10-CM | POA: Insufficient documentation

## 2023-01-05 DIAGNOSIS — I10 Essential (primary) hypertension: Secondary | ICD-10-CM | POA: Insufficient documentation

## 2023-01-05 DIAGNOSIS — R519 Headache, unspecified: Secondary | ICD-10-CM | POA: Diagnosis present

## 2023-01-05 DIAGNOSIS — Z79899 Other long term (current) drug therapy: Secondary | ICD-10-CM | POA: Insufficient documentation

## 2023-01-05 LAB — TROPONIN I (HIGH SENSITIVITY)
Troponin I (High Sensitivity): 3 ng/L (ref <18)
Troponin I (High Sensitivity): 4 ng/L (ref <18)

## 2023-01-05 LAB — BASIC METABOLIC PANEL
Anion gap: 11 (ref 5–15)
BUN: 13 mg/dL (ref 6–20)
CO2: 22 mmol/L (ref 22–32)
Calcium: 8.6 mg/dL — ABNORMAL LOW (ref 8.9–10.3)
Chloride: 101 mmol/L (ref 98–111)
Creatinine, Ser: 0.77 mg/dL (ref 0.44–1.00)
GFR, Estimated: >60 mL/min (ref >60)
Glucose, Bld: 116 mg/dL — ABNORMAL HIGH (ref 70–99)
Potassium: 3.5 mmol/L (ref 3.5–5.1)
Sodium: 134 mmol/L — ABNORMAL LOW (ref 135–145)

## 2023-01-05 LAB — CBC
HCT: 39.9 % (ref 36.0–46.0)
Hemoglobin: 13.7 g/dL (ref 12.0–15.0)
MCH: 31.1 pg (ref 26.0–34.0)
MCHC: 34.3 g/dL (ref 30.0–36.0)
MCV: 90.7 fL (ref 80.0–100.0)
Platelets: 312 10*3/uL (ref 150–400)
RBC: 4.4 MIL/uL (ref 3.87–5.11)
RDW: 13.1 % (ref 11.5–15.5)
WBC: 4.1 10*3/uL (ref 4.0–10.5)
nRBC: 0 % (ref 0.0–0.2)

## 2023-01-05 MED ORDER — HYDRALAZINE HCL 50 MG PO TABS
50.0000 mg | ORAL_TABLET | Freq: Two times a day (BID) | ORAL | Status: DC
  Filled 2023-01-05: qty 1

## 2023-01-05 MED ORDER — HYDRALAZINE HCL 25 MG PO TABS: 50.0000 mg | ORAL_TABLET | Freq: Two times a day (BID) | ORAL | Status: DC

## 2023-01-05 MED ORDER — HYDRALAZINE HCL 25 MG PO TABS
50.0000 mg | ORAL_TABLET | Freq: Once | ORAL | Status: AC
  Administered 2023-01-05: 50 mg via ORAL
  Filled 2023-01-05: qty 2

## 2023-01-05 MED ORDER — HYDRALAZINE HCL 100 MG PO TABS
50.0000 mg | ORAL_TABLET | Freq: Two times a day (BID) | ORAL | 0 refills | Status: DC
Start: 2023-01-05 — End: 2023-01-05
  Filled 2023-01-05: qty 30, 30d supply, fill #0

## 2023-01-05 MED ORDER — HYDRALAZINE HCL 100 MG PO TABS: 50.0000 mg | ORAL_TABLET | Freq: Two times a day (BID) | ORAL | 0 refills | Status: DC

## 2023-01-05 MED ORDER — METOPROLOL TARTRATE 50 MG PO TABS
50.0000 mg | ORAL_TABLET | Freq: Two times a day (BID) | ORAL | 0 refills | Status: DC
Start: 2023-01-05 — End: 2023-01-05
  Filled 2023-01-05: qty 60, 30d supply, fill #0

## 2023-01-05 MED ORDER — METOPROLOL TARTRATE 50 MG PO TABS
50.0000 mg | ORAL_TABLET | Freq: Two times a day (BID) | ORAL | 0 refills | Status: AC
Start: 2023-01-05 — End: 2023-02-04

## 2023-01-05 NOTE — Discharge Instructions (Signed)
Evaluation for your symptoms today were overall reassuring.  I sent metoprolol and hydralazine to your pharmacy.  Recommend you follow-up with your PCP.  If you have worsening headache, chest pain, visual disturbance or any other concern please return emergency department for evaluation.

## 2023-01-05 NOTE — ED Triage Notes (Addendum)
Pt reports with chest pain, headache, and HTN since Friday. Pt complains of blurred vision and states that her left side felt different. Pt reports not taking her HTN and prescribed due to not being able to afford her medications. Pt states that she has been lying down since Friday.

## 2023-01-05 NOTE — ED Provider Notes (Signed)
[place] EMERGENCY DEPARTMENT AT [facility] Provider Note   CSN: [id number] Arrival date & time: 01/05/23  0534     History  Chief Complaint  Patient presents with   Hypertension   Chest Pain   Headache   HPI Becky Vargas is a 52 y.o. female with SVT, hypertension presenting for hypertension chest pain and headache.  Symptoms started yesterday evening.  Headache is primarily located in the left side of her head and radiates to the back of her head.  Denies visual disturbance.  Denies dizziness or changes in gait.  Chest pain is a stabbing pain in her left chest that is nonradiating.  Denies associated shortness of breath.  States she has had the symptoms before when her blood pressure was high.  Reports that she ran out of her blood pressure medicine this past Tuesday and the weeks prior had been using her blood pressure medicine every other day because her supply was low.  Requesting refill.  States that this time both her chest pain and her headache have subsided.   Hypertension Associated symptoms include chest pain and headaches.  Chest Pain Associated symptoms: headache   Headache      Home Medications Prior to Admission medications   Medication Sig Start Date End Date Taking? Authorizing Provider  aspirin-acetaminophen-caffeine (EXCEDRIN MIGRAINE) [phone] MG tablet Take 1 tablet by mouth every 6 (six) hours as needed for headache.    [provider]  doxycycline (VIBRA-TABS) 100 MG tablet Take 1 tablet (100 mg total) by mouth 2 (two) times daily. 01/06/20   Linwood Dibbles, MD  escitalopram (LEXAPRO) 10 MG tablet Take 1 tablet (10 mg total) by mouth daily. Patient not taking: Reported on 02/25/2018 01/03/17   Loletta Specter, PA-C  hydrALAZINE (APRESOLINE) 100 MG tablet Take 0.5 tablets (50 mg total) by mouth 2 (two) times daily. 01/05/23 02/04/23  Gareth Eagle, PA-C  hydrochlorothiazide (HYDRODIURIL) 25 MG tablet Take 1 tablet (25 mg total) by  mouth daily. 08/10/21   Prosperi, Christian H, PA-C  lidocaine (XYLOCAINE) 2 % solution Use as directed 15 mLs in the mouth or throat as needed for mouth pain. 08/10/21   Prosperi, Christian H, PA-C  metoprolol tartrate (LOPRESSOR) 50 MG tablet Take 1 tablet (50 mg total) by mouth 2 (two) times daily. 01/05/23 02/04/23  Gareth Eagle, PA-C  oxyCODONE-acetaminophen (PERCOCET/ROXICET) 5-325 MG tablet Take 1 tablet by mouth every 6 (six) hours as needed for severe pain. 09/08/21   Mancel Bale, MD      Allergies    Sulfa antibiotics    Review of Systems   Review of Systems  Cardiovascular:  Positive for chest pain.  Neurological:  Positive for headaches.    Physical Exam   Vitals:   01/05/23 0745 01/05/23 0901  BP: (!) 239/118 (!) 195/98  Pulse:  76  Resp: 17 20  Temp:    SpO2:  100%    CONSTITUTIONAL:  well-appearing, NAD NEURO:  GCS 15. Speech is goal oriented. No deficits appreciated to CN III-XII; symmetric eyebrow raise, no facial drooping, tongue midline. Patient has equal grip strength bilaterally with 5/5 strength against resistance in all major muscle groups bilaterally. Sensation to light touch intact. Patient moves extremities without ataxia. Normal finger-nose-finger. Patient ambulatory with steady gait. EYES:  eyes equal and reactive ENT/NECK:  Supple, no stridor  CARDIO:  Regular rate and rhythm, appears well-perfused  PULM:  No respiratory distress, CTAB GI/GU:  non-distended, soft MSK/SPINE:  No gross  deformities, no edema, moves all extremities  SKIN:  no rash, atraumatic  *Additional and/or pertinent findings included in MDM below  ED Results / Procedures / Treatments   Labs (all labs ordered are listed, but only abnormal results are displayed) Labs Reviewed  BASIC METABOLIC PANEL - Abnormal; Notable for the following components:      Result Value   Sodium 134 (*)    Glucose, Bld 116 (*)    Calcium 8.6 (*)    All other components within normal limits  CBC   TROPONIN I (HIGH SENSITIVITY)  TROPONIN I (HIGH SENSITIVITY)    EKG EKG Interpretation Date/Time:  Sunday January 05 2023 05:45:39 EDT Ventricular Rate:  68 PR Interval:  145 QRS Duration:  71 QT Interval:  409 QTC Calculation: 435 R Axis:   78  Text Interpretation: Sinus rhythm Left ventricular hypertrophy Anterior infarct, old Confirmed by Tilden Fossa [phone]) on 01/05/2023 5:52:15 AM  Radiology CT Head Wo Contrast  Result Date: 01/05/2023 CLINICAL DATA:  New onset headache.  Hypertension. EXAM: CT HEAD WITHOUT CONTRAST TECHNIQUE: Contiguous axial images were obtained from the base of the skull through the vertex without intravenous contrast. RADIATION DOSE REDUCTION: This exam was performed according to the departmental dose-optimization program which includes automated exposure control, adjustment of the mA and/or kV according to patient size and/or use of iterative reconstruction technique. COMPARISON:  Study of 11/18/2021. FINDINGS: Brain: No evidence of acute infarction, hemorrhage, hydrocephalus, extra-axial collection or mass lesion/mass effect. Vascular: No hyperdense vessel or unexpected calcification. Skull: Negative for fracture or focal lesions. Sinuses/Orbits: No acute finding. Other: None. IMPRESSION: No acute intracranial CT findings.  Stable exam. Electronically Signed   By: Almira Bar M.D.   On: 01/05/2023 07:02   DG Chest Port 1 View  Result Date: 01/05/2023 CLINICAL DATA:  52 year old female with history of chest pain. EXAM: PORTABLE CHEST 1 VIEW COMPARISON:  Chest x-ray 08/10/2021. FINDINGS: Lung volumes are normal. No consolidative airspace disease. No pleural effusions. No pneumothorax. No pulmonary nodule or mass noted. Pulmonary vasculature and the cardiomediastinal silhouette are within normal limits. IMPRESSION: No radiographic evidence of acute cardiopulmonary disease. Electronically Signed   By: Trudie Reed M.D.   On: 01/05/2023 06:25     Procedures Procedures    Medications Ordered in ED Medications  hydrALAZINE (APRESOLINE) tablet 50 mg (50 mg Oral Given 01/05/23 3244)    ED Course/ Medical Decision Making/ A&P                             Medical Decision Making Amount and/or Complexity of Data Reviewed Labs: ordered.  Risk Prescription drug management.   52 year old well-appearing female presenting for chest pain, headache and hypertension.  Exam was unremarkable.  DDx includes hypertensive emergency, stroke, ACS, AKI, renal artery stenosis.  Labs reassuring.  No acute findings on chest x-ray or CT of her head.  Patient reported that her symptoms had improved.  On reassessment reported that she was asymptomatic.  BP did trend down with treatment of her home dose of hydralazine.  Sent refills to her pharmacy of her hydralazine and metoprolol.  Advised her to follow-up with PCP.  Discussed pertinent return precautions.  Vital stable discharge.        Final Clinical Impression(s) / ED Diagnoses Final diagnoses:  Hypertension, unspecified type    Rx / DC Orders ED Discharge Orders          Ordered  hydrALAZINE (APRESOLINE) 100 MG tablet  2 times daily        01/05/23 0911    metoprolol tartrate (LOPRESSOR) 50 MG tablet  2 times daily        01/05/23 0911              Gareth Eagle, PA-C 01/05/23 0915    Melene Plan, DO 01/05/23 [phone]

## 2023-01-18 IMAGING — CT CT HEAD W/O CM
3 series · 16 of 47 positions shown, 19 images · non-contrast
Comparison: None Available.

CLINICAL DATA: Worsening headache.



[Series 2: head wo · axial · 0.42mm/px · z∈[+1288,+1418]mm · 10 of 32 slices shown, 13 images]
[im 3/32  brain]
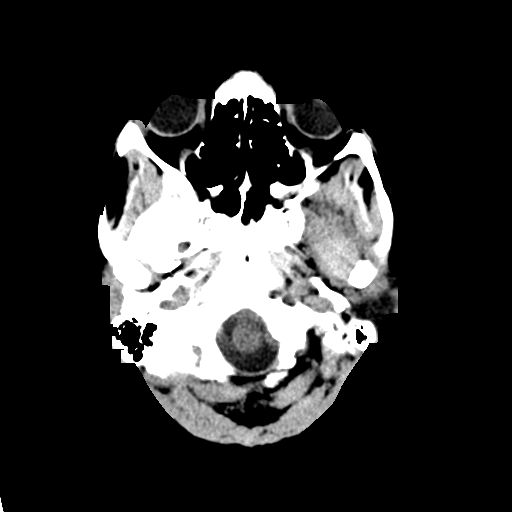
[im 3/32  bone]
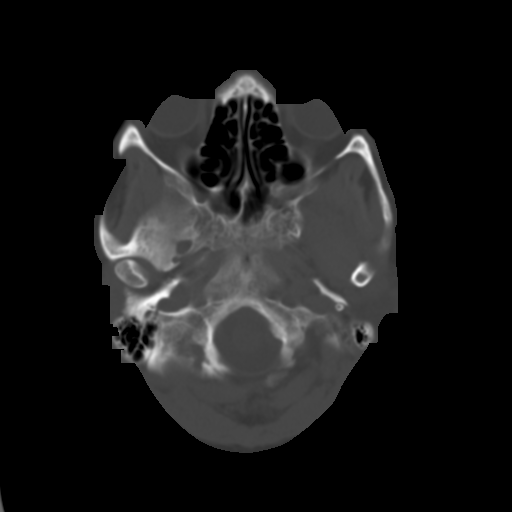
[im 6/32  brain]
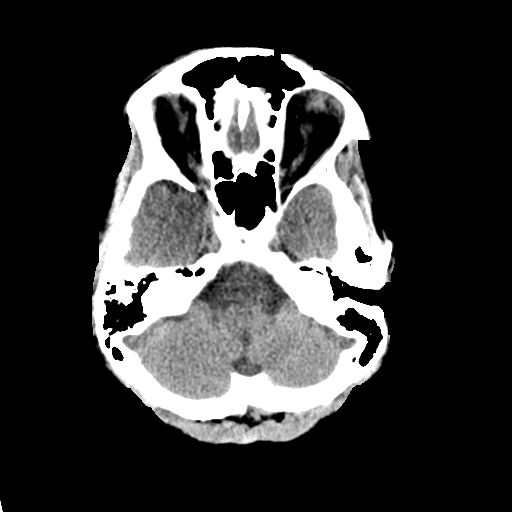
[im 9/32  brain]
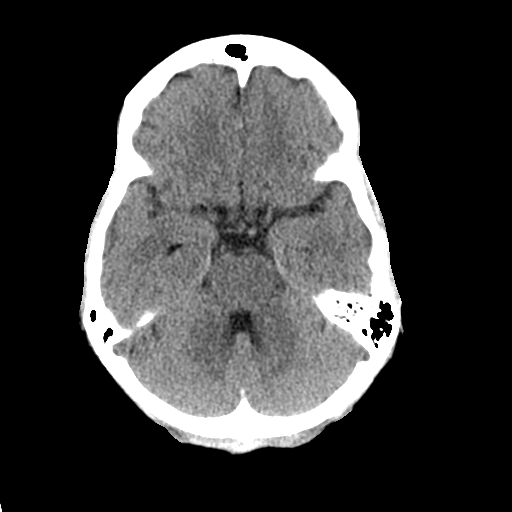
[im 11/32  brain]
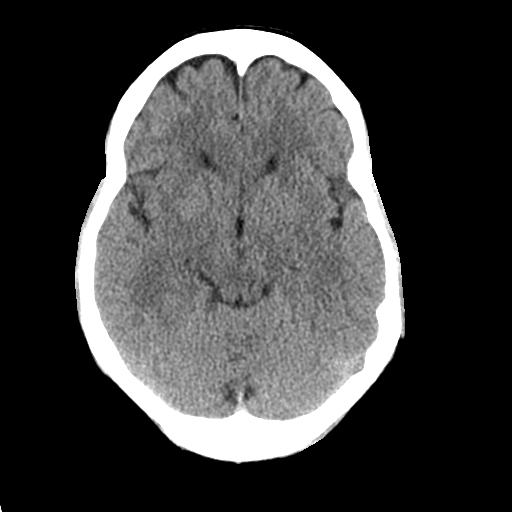
[im 14/32  brain]
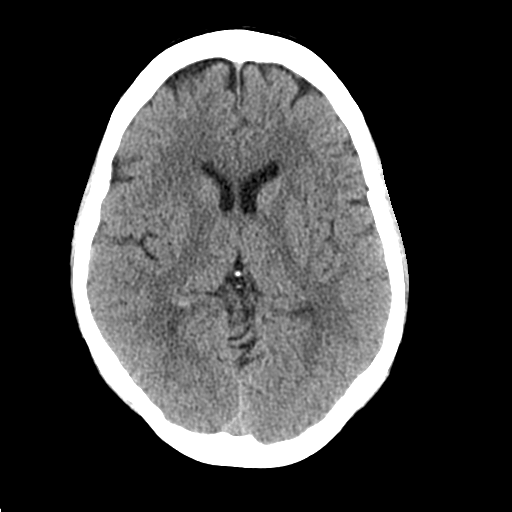
[im 14/32  bone]
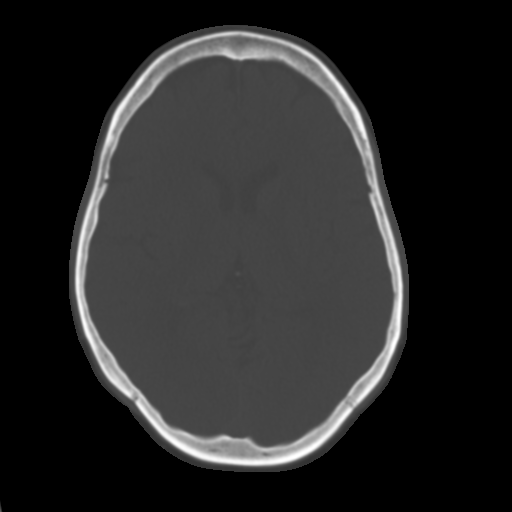
[im 18/32  brain]
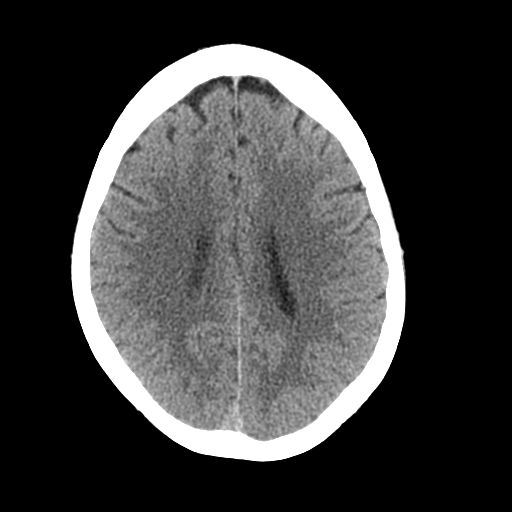
[im 21/32  brain]
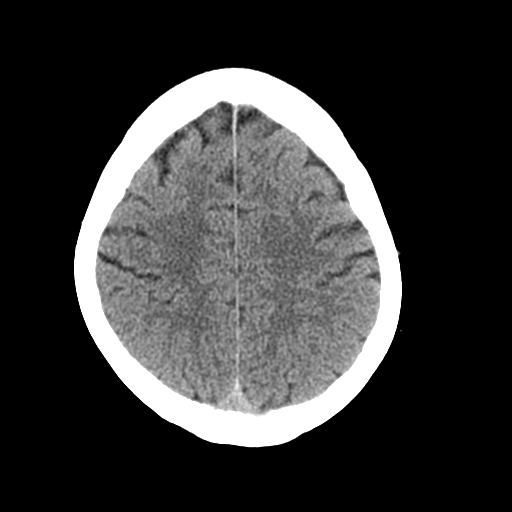
[im 24/32  brain]
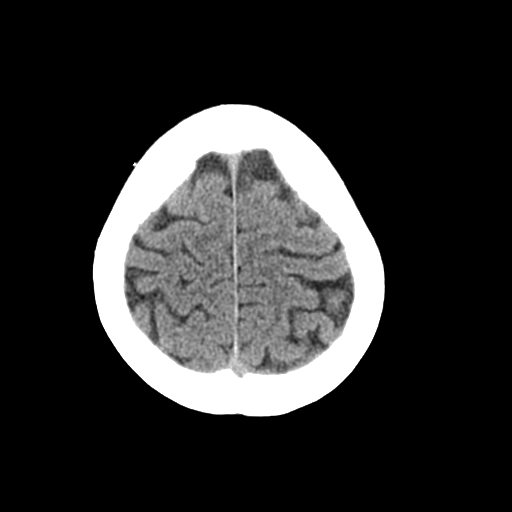
[im 26/32  brain]
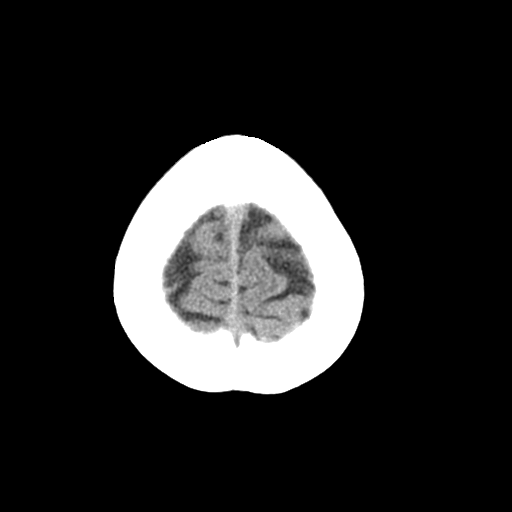
[im 26/32  bone]
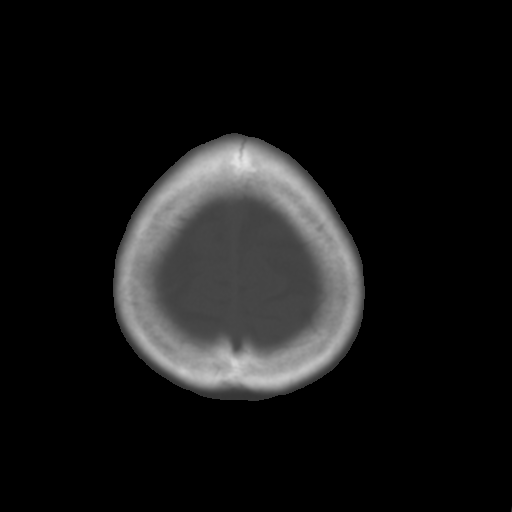
[im 29/32  brain]
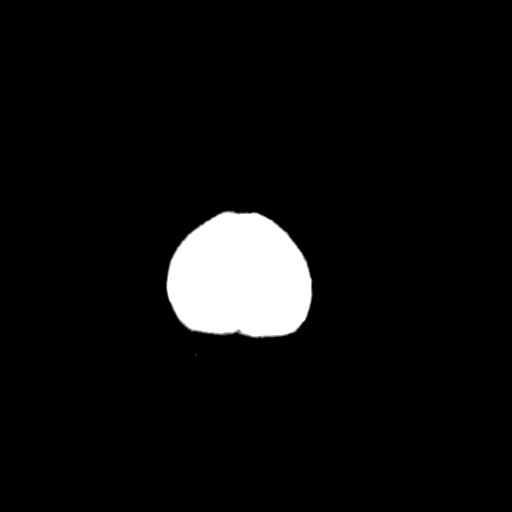

[Series 4: coronal soft tissue · coronal · 0.33mm/px · 3 of 65 slices shown]
[im 22/65  brain]
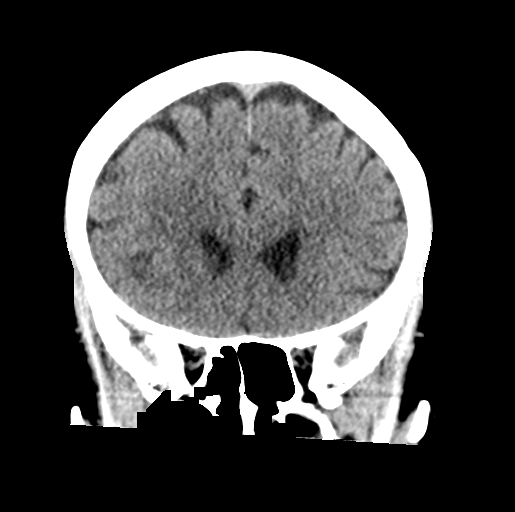
[im 29/65  brain]
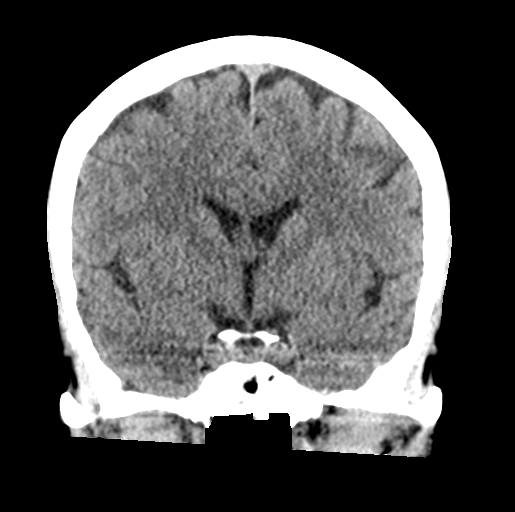
[im 36/65  brain]
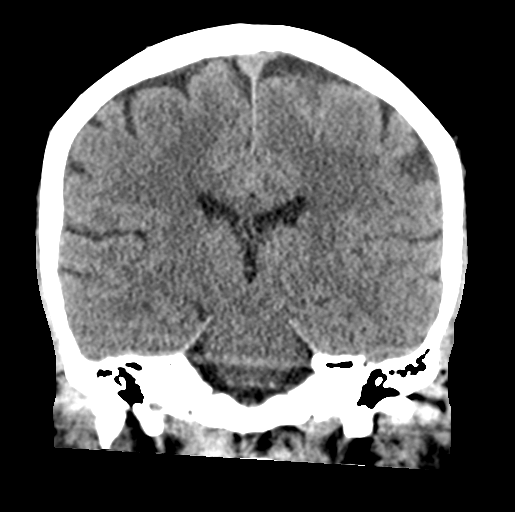

[Series 5: sagittal soft tissue · sagittal · 0.33mm/px · 3 of 55 slices shown]
[im 19/55  brain]
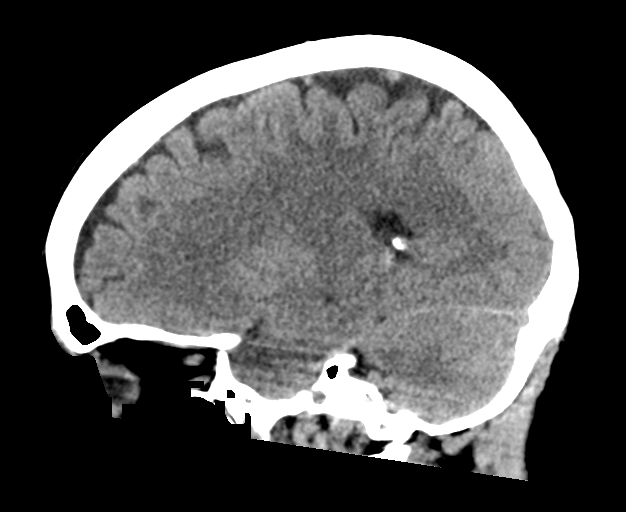
[im 28/55  brain]
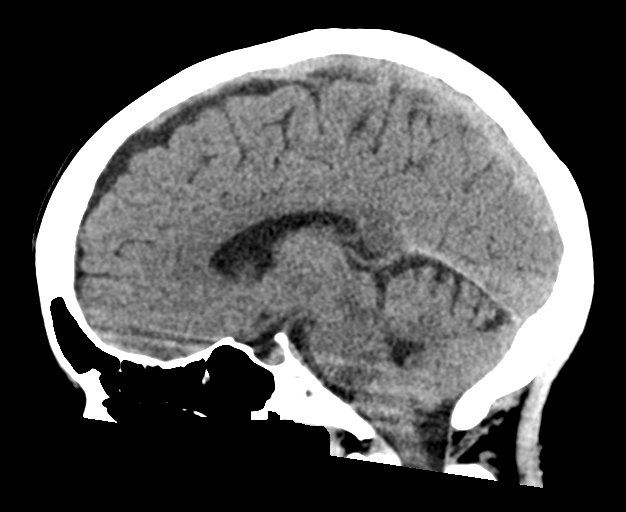
[im 37/55  brain]
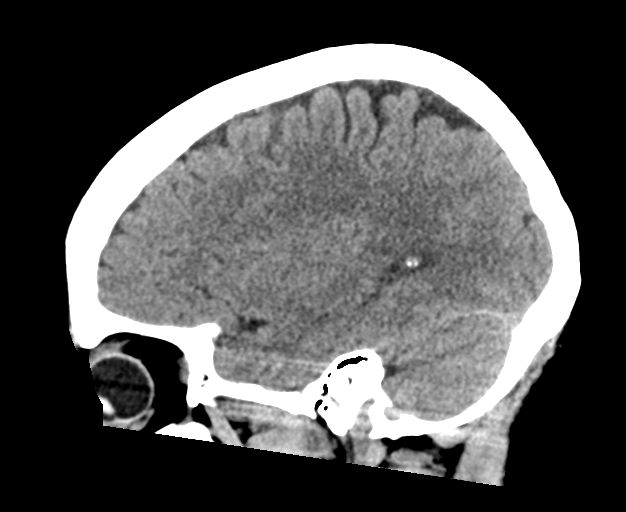

[16 of 47 positions shown; findings below may reference images not displayed]

FINDINGS: Brain: No evidence of acute infarction, hemorrhage, hydrocephalus,
extra-axial collection or mass lesion/mass effect.

Vascular: No hyperdense vessel or unexpected calcification.

Skull: Normal. Negative for fracture or focal lesion.

Sinuses/Orbits: There is mild left maxillary sinus mucosal
thickening.

Other: None.
IMPRESSION: 1. No acute intracranial abnormality.
2. Mild left maxillary sinus disease.

## 2024-02-20 ENCOUNTER — Ambulatory Visit
Admission: RE | Admit: 2024-02-20 | Discharge: 2024-02-20 | Disposition: A | Discharge status: Home / Self Care | Payer: Self-pay | Source: Ambulatory Visit | Attending: Urgent Care | Admitting: Urgent Care

## 2024-02-20 VITALS — BP 175/99 | HR 93 | Temp 99.2°F | Resp 16

## 2024-02-20 DIAGNOSIS — L249 Irritant contact dermatitis, unspecified cause: Secondary | ICD-10-CM

## 2024-02-20 MED ORDER — BACITRACIN ZINC 500 UNIT/GM EX OINT: 1.0000 | TOPICAL_OINTMENT | Freq: Two times a day (BID) | CUTANEOUS | 0 refills | Status: DC

## 2024-02-20 NOTE — ED Triage Notes (Signed)
 Pt reports she has a insect bite in the left ankle x 5-6 days. Reports the area is itching.  Cortisone cream and mosquito bite cream give some relief.

## 2024-02-20 NOTE — ED Provider Notes (Signed)
 [facility] - URGENT CARE CENTER  Note:  This document was prepared using Conservation officer, historic buildings and may include unintentional dictation errors.  MRN: [id number] DOB: [birth year]  Subjective:   [name] is a 53 y.o. female presenting for 6 day history of persistent testicular like lesion with pruritus.  Believes it was an insect bite or mosquito but cannot recall seeing anything in particular.  Would like to have the area incised and drained.  Has used cortisone cream, mosquito bite relief.  No current facility-administered medications for this encounter.  Current Outpatient Medications:    aspirin -acetaminophen -caffeine (EXCEDRIN MIGRAINE) 250-250-65 MG tablet, Take 1 tablet by mouth every 6 (six) hours as needed for headache., Disp: , Rfl:    doxycycline  (VIBRA -TABS) 100 MG tablet, Take 1 tablet (100 mg total) by mouth 2 (two) times daily., Disp: 14 tablet, Rfl: 0   escitalopram  (LEXAPRO ) 10 MG tablet, Take 1 tablet (10 mg total) by mouth daily. (Patient not taking: Reported on 02/25/2018), Disp: 30 tablet, Rfl: 2   hydrALAZINE  (APRESOLINE ) 100 MG tablet, Take 0.5 tablets (50 mg total) by mouth 2 (two) times daily., Disp: 30 tablet, Rfl: 0   hydrochlorothiazide  (HYDRODIURIL ) 25 MG tablet, Take 1 tablet (25 mg total) by mouth daily., Disp: 30 tablet, Rfl: 0   lidocaine  (XYLOCAINE ) 2 % solution, Use as directed 15 mLs in the mouth or throat as needed for mouth pain., Disp: 100 mL, Rfl: 0   metoprolol  tartrate (LOPRESSOR ) 50 MG tablet, Take 1 tablet (50 mg total) by mouth 2 (two) times daily., Disp: 60 tablet, Rfl: 0   oxyCODONE -acetaminophen  (PERCOCET/ROXICET) 5-325 MG tablet, Take 1 tablet by mouth every 6 (six) hours as needed for severe pain., Disp: 15 tablet, Rfl: 0   Allergies  Allergen Reactions   Sulfa Antibiotics Rash    Past Medical History:  Diagnosis Date   Colitis    Endometriosis    Hypertension    Irritable bowel syndrome (IBS)    SVT  (supraventricular tachycardia) (HCC)      Past Surgical History:  Procedure Laterality Date   COLONOSCOPY     LAPAROSCOPY      Family History  Problem Relation Age of Onset   Diabetes Mother    Diabetes Father    Hypertension Sister    Hypertension Brother     Social History   Tobacco Use   Smoking status: Every Day    Current packs/day: 0.35    Types: Cigarettes   Smokeless tobacco: Never  Vaping Use   Vaping status: Never Used  Substance Use Topics   Alcohol use: Not Currently    Alcohol/week: 5.0 standard drinks of alcohol    Types: 5 Glasses of wine per week   Drug use: Not Currently    Types: Marijuana    Comment: occasionally     ROS   Objective:   Vitals: BP (!) 175/99 (BP Location: Left Arm)   Pulse 93   Temp 99.2 F (37.3 C) (Oral)   Resp 16   LMP 11/13/2016 (Approximate)   SpO2 95%   Physical Exam Constitutional:      General: She is not in acute distress.    Appearance: Normal appearance. She is well-developed. She is not ill-appearing, toxic-appearing or diaphoretic.  HENT:     Head: Normocephalic and atraumatic.     Nose: Nose normal.     Mouth/Throat:     Mouth: Mucous membranes are moist.  Eyes:     General: No scleral icterus.  Right eye: No discharge.        Left eye: No discharge.     Extraocular Movements: Extraocular movements intact.  Cardiovascular:     Rate and Rhythm: Normal rate.  Pulmonary:     Effort: Pulmonary effort is normal.  Musculoskeletal:       Legs:  Skin:    General: Skin is warm and dry.  Neurological:     General: No focal deficit present.     Mental Status: She is alert and oriented to person, place, and time.  Psychiatric:        Mood and Affect: Mood normal.        Behavior: Behavior normal.       Patient requested incision and drainage.  Sterile prep with iodine.  Topical numbing spray.  Incision was made using an 11 blade. ~1cc of serous fluid drained.  Cleansed and  dressed.  Assessment and Plan :   PDMP not reviewed this encounter.  1. Irritant dermatitis    At patient's insistence, incision and drainage was performed.  No signs of secondary bacterial infection.  Recommend wound care, use of bacitracin .  Oral antihistamine as needed.  Counseled patient on potential for adverse effects with medications prescribed/recommended today, ER and return-to-clinic precautions discussed, patient verbalized understanding.    Christopher Savannah, PA-C 02/20/24 1141

## 2024-02-20 NOTE — Discharge Instructions (Signed)
 Change your dressing 2-3 times daily. Every time you change your dressing, clean the wound gently with warm water and Dial antibacterial soap. Pat the wound dry, let it breathe for roughly an hour before covering it back up. When you reapply a dressing, apply Bacitracin  ointment to the wound, then cover with non-stick/non-adherent gauze.  Did not applied steroid to the area.  You can take Zyrtec or Benadryl by mouth.

## 2024-04-02 ENCOUNTER — Other Ambulatory Visit: Payer: Self-pay

## 2024-04-02 ENCOUNTER — Emergency Department (HOSPITAL_COMMUNITY): Payer: Self-pay

## 2024-04-02 ENCOUNTER — Emergency Department (HOSPITAL_COMMUNITY)
Admission: EM | Admit: 2024-04-02 | Discharge: 2024-04-02 | Disposition: A | Discharge status: Home / Self Care | Payer: Self-pay | Attending: Emergency Medicine | Admitting: Emergency Medicine

## 2024-04-02 ENCOUNTER — Encounter (HOSPITAL_COMMUNITY): Payer: Self-pay | Admitting: Emergency Medicine

## 2024-04-02 DIAGNOSIS — Z7982 Long term (current) use of aspirin: Secondary | ICD-10-CM | POA: Insufficient documentation

## 2024-04-02 DIAGNOSIS — Y9301 Activity, walking, marching and hiking: Secondary | ICD-10-CM | POA: Insufficient documentation

## 2024-04-02 DIAGNOSIS — W298XXA Contact with other powered powered hand tools and household machinery, initial encounter: Secondary | ICD-10-CM | POA: Insufficient documentation

## 2024-04-02 DIAGNOSIS — S60221A Contusion of right hand, initial encounter: Secondary | ICD-10-CM

## 2024-04-02 DIAGNOSIS — S60021A Contusion of right index finger without damage to nail, initial encounter: Secondary | ICD-10-CM | POA: Insufficient documentation

## 2024-04-02 DIAGNOSIS — R21 Rash and other nonspecific skin eruption: Secondary | ICD-10-CM | POA: Insufficient documentation

## 2024-04-02 MED ORDER — CETIRIZINE HCL 10 MG PO TABS: 10.0000 mg | ORAL_TABLET | Freq: Every day | ORAL | 0 refills | Status: DC

## 2024-04-02 MED ORDER — HYDRALAZINE HCL 100 MG PO TABS: 50.0000 mg | ORAL_TABLET | Freq: Two times a day (BID) | ORAL | 0 refills | Status: AC

## 2024-04-02 MED ORDER — CEPHALEXIN 500 MG PO CAPS: 500.0000 mg | ORAL_CAPSULE | Freq: Four times a day (QID) | ORAL | 0 refills | Status: DC

## 2024-04-02 MED ORDER — HYDROCHLOROTHIAZIDE 25 MG PO TABS: 25.0000 mg | ORAL_TABLET | Freq: Every day | ORAL | 0 refills | Status: AC

## 2024-04-02 NOTE — ED Provider Notes (Signed)
 [place] EMERGENCY DEPARTMENT AT [facility] Provider Note   CSN: [id number] Arrival date & time: 04/02/24  9057     Patient presents with: Hand Pain   Becky Vargas is a 53 y.o. female.   HPI   53 year old female presents emergency department with right hand injury and concern for rash.  Patient states about a week ago she was walking when she had her right hand on a karaoke machine because she was walking close to it.  She had immediate pain.  For the past week she has had ongoing pain mainly at the base of the right index finger with some bruising and swelling.  Denies any numbness.  She also has concern for multiple bites along her body.  She believes her some sort of insect that has bitten her and then she has a localized allergic reaction with some serosanguineous drainage and then resulting redness.  Denies any other systemic symptoms.  Has been using hydrocortisone  cream without significant relief.  Prior to Admission medications   Medication Sig Start Date End Date Taking? Authorizing Provider  aspirin -acetaminophen -caffeine (EXCEDRIN MIGRAINE) 250-250-65 MG tablet Take 1 tablet by mouth every 6 (six) hours as needed for headache.    [provider]  bacitracin  ointment Apply 1 Application topically 2 (two) times daily. 02/20/24   Christopher Savannah, PA-C  doxycycline  (VIBRA -TABS) 100 MG tablet Take 1 tablet (100 mg total) by mouth 2 (two) times daily. 01/06/20   Randol Simmonds, MD  escitalopram  (LEXAPRO ) 10 MG tablet Take 1 tablet (10 mg total) by mouth daily. Patient not taking: Reported on 02/25/2018 01/03/17   Kristy Sharolyn Lenis, PA-C  hydrALAZINE  (APRESOLINE ) 100 MG tablet Take 0.5 tablets (50 mg total) by mouth 2 (two) times daily. 01/05/23 02/04/23  Robinson, John K, PA-C  hydrochlorothiazide  (HYDRODIURIL ) 25 MG tablet Take 1 tablet (25 mg total) by mouth daily. 08/10/21   Prosperi, Christian H, PA-C  lidocaine  (XYLOCAINE ) 2 % solution Use as directed 15 mLs in the  mouth or throat as needed for mouth pain. 08/10/21   Prosperi, Christian H, PA-C  metoprolol  tartrate (LOPRESSOR ) 50 MG tablet Take 1 tablet (50 mg total) by mouth 2 (two) times daily. 01/05/23 02/04/23  Robinson, John K, PA-C  oxyCODONE -acetaminophen  (PERCOCET/ROXICET) 5-325 MG tablet Take 1 tablet by mouth every 6 (six) hours as needed for severe pain. 09/08/21   Lorriane Holmes, MD    Allergies: Sulfa antibiotics    Review of Systems  Constitutional:  Negative for fever.  Respiratory:  Negative for shortness of breath.   Cardiovascular:  Negative for chest pain.  Musculoskeletal:        + Right hand injury  Skin:  Positive for rash.  Neurological:  Negative for numbness.    Updated Vital Signs BP (!) 206/96 (BP Location: Left Arm)   Pulse 88   Temp 98.6 F (37 C) (Oral)   Ht 4' 11 (1.499 m)   Wt 45.4 kg   LMP 11/13/2016 (Approximate)   SpO2 100%   BMI 20.20 kg/m   Physical Exam Vitals and nursing note reviewed.  Constitutional:      General: She is not in acute distress.    Appearance: Normal appearance.  HENT:     Head: Normocephalic.     Mouth/Throat:     Mouth: Mucous membranes are moist.  Cardiovascular:     Rate and Rhythm: Normal rate.  Musculoskeletal:     Comments: Very mild ecchymosis and minimal swelling at the base of  the right index finger.  Range of motion intact.  No wrist pain or snuffbox tenderness.  Skin:    General: Skin is warm.     Comments: Scattered singular papula along the lower extremities and torso.  Appear to be clear blister formation with oozing and then mild surrounding redness  Neurological:     Mental Status: She is alert and oriented to person, place, and time. Mental status is at baseline.  Psychiatric:        Mood and Affect: Mood normal.     (all labs ordered are listed, but only abnormal results are displayed) Labs Reviewed - No data to display  EKG: None  Radiology: DG Hand Complete Right Result Date: 04/02/2024 CLINICAL  DATA:  hand pain EXAM: RIGHT HAND - COMPLETE 3+ VIEW COMPARISON:  None Available. FINDINGS: No acute fracture or dislocation. There is no evidence of arthropathy or other focal bone abnormality. Soft tissues are unremarkable. No radiopaque foreign body. IMPRESSION: No acute fracture or dislocation. Electronically Signed   By: Rogelia Myers M.D.   On: 04/02/2024 10:33     Procedures   Medications Ordered in the ED - No data to display                                  Medical Decision Making Amount and/or Complexity of Data Reviewed Radiology: ordered.   53 year old female presents emergency department with right hand injury as well as rash on the legs and torso.  Right hand x-ray is negative.  Rash looks like scattered bug bites.  She has pictures that then shows blister formation with serosanguineous drainage and now she has small areas of erythema, dried skin, possible superimposed cellulitis.  Discussed with the patient oral antihistamine and oral antibiotic and follow-up with dermatology.  Of note patient was hypertensive in triage.  She admits that she has self stopped all of her blood pressure medication.  Denies any red flag symptoms in regards to this.  She is amendable to starting again with new prescriptions.  I have sent all these new prescriptions.  Offered first dose here and she declines.  Will plan for outpatient follow-up.  Patient at this time appears safe and stable for discharge and close outpatient follow up. Discharge plan and strict return to ED precautions discussed, patient verbalizes understanding and agreement.     Final diagnoses:  None    ED Discharge Orders     None          Bari Roxie HERO, DO 04/02/24 1146

## 2024-04-02 NOTE — ED Triage Notes (Signed)
 Pt reports she hit her hand a a karaoke machine 1 week ago. Pt reports continued pain and swelling to the hand.

## 2024-04-02 NOTE — Discharge Instructions (Signed)
 You have been seen and discharged from the emergency department.  Your hand x-ray shows no fracture.  Take oral antihistamine, Zyrtec daily for bug bite/allergy.  Take antibiotic for infection.  I have reordered your blood pressure medications, fill and take this as directed.  Follow-up with your primary doctor for repeat blood pressure.  Follow-up with your primary provider for further evaluation and further care. Take home medications as prescribed. If you have any worsening symptoms or further concerns for your health please return to an emergency department for further evaluation.

## 2024-06-18 ENCOUNTER — Encounter (HOSPITAL_COMMUNITY): Payer: Self-pay | Admitting: Emergency Medicine

## 2024-06-18 ENCOUNTER — Emergency Department (HOSPITAL_COMMUNITY)
Admission: EM | Admit: 2024-06-18 | Discharge: 2024-06-18 | Disposition: A | Discharge status: Home / Self Care | Payer: Self-pay | Attending: Emergency Medicine | Admitting: Emergency Medicine

## 2024-06-18 ENCOUNTER — Other Ambulatory Visit: Payer: Self-pay

## 2024-06-18 DIAGNOSIS — B354 Tinea corporis: Secondary | ICD-10-CM | POA: Insufficient documentation

## 2024-06-18 DIAGNOSIS — I10 Essential (primary) hypertension: Secondary | ICD-10-CM | POA: Insufficient documentation

## 2024-06-18 DIAGNOSIS — Z79899 Other long term (current) drug therapy: Secondary | ICD-10-CM | POA: Insufficient documentation

## 2024-06-18 DIAGNOSIS — Z7982 Long term (current) use of aspirin: Secondary | ICD-10-CM | POA: Insufficient documentation

## 2024-06-18 MED ORDER — HYDROCHLOROTHIAZIDE 25 MG PO TABS
25.0000 mg | ORAL_TABLET | Freq: Once | ORAL | Status: AC
  Administered 2024-06-18: 25 mg via ORAL
  Filled 2024-06-18: qty 1

## 2024-06-18 MED ORDER — BACITRACIN ZINC 500 UNIT/GM EX OINT: 1.0000 | TOPICAL_OINTMENT | Freq: Two times a day (BID) | CUTANEOUS | 4 refills | Status: AC

## 2024-06-18 MED ORDER — CETIRIZINE HCL 10 MG PO TABS: 10.0000 mg | ORAL_TABLET | Freq: Every day | ORAL | 3 refills | Status: AC

## 2024-06-18 MED ORDER — TERBINAFINE HCL 250 MG PO TABS: 250.0000 mg | ORAL_TABLET | Freq: Every day | ORAL | 0 refills | Status: AC

## 2024-06-18 MED ORDER — METOPROLOL TARTRATE 25 MG PO TABS
50.0000 mg | ORAL_TABLET | Freq: Once | ORAL | Status: AC
  Administered 2024-06-18: 50 mg via ORAL
  Filled 2024-06-18: qty 2

## 2024-06-18 MED ORDER — HYDRALAZINE HCL 25 MG PO TABS
50.0000 mg | ORAL_TABLET | Freq: Once | ORAL | Status: AC
  Administered 2024-06-18: 50 mg via ORAL
  Filled 2024-06-18: qty 2

## 2024-06-18 NOTE — Discharge Instructions (Addendum)
 You may continue applying bacitracin  ointment to the lesions.  Please take cetirizine  once a day.  This is to help with your itching.  You may also add diphenhydramine (Benadryl) every 4-6 hours as needed.  She has  Continue using bacitracin  ointment.  I have ordered a prescription for terbinafine  to treat a fungal infection on your skin.  It is very important that you take it once a day, every day.  I recommend that you follow-up with your primary care provider to evaluate these lesions after completing the course of terbinafine .  Stop using hydrocortisone  cream since it can make fungal infections worse.  Your blood pressure was very high today.  It is very important that you take your blood pressure medication every day.  You should also take your blood pressure every day and keep a record of it and take that record with you whenever you see your primary care provider.  Inadequately controlled high blood pressure can lead to heart attacks, strokes, kidney failure.

## 2024-06-18 NOTE — ED Triage Notes (Addendum)
 Pt reports she has had 2 recent instances where she has had insect bites that have had to have antibiotics.  Pt reports she has lesions to a few places on her body and has been applying bacitracin  and cortisone cream.  Also reports starting to itch in other areas of her body and believes she has cellulitis. Pt is hypertensive at time of triage and reports non compliance with meds

## 2024-06-18 NOTE — ED Provider Notes (Signed)
 " [place] EMERGENCY DEPARTMENT AT [facility] Provider Note   CSN: [id number] Arrival date & time: 06/18/24  0010     Patient presents with: Insect Bite   Becky Vargas is a 54 y.o. female.   The history is provided by the patient.   She has history of hypertension, SVT and comes in because of worsening itching at site of insect bites on her abdomen and leg.  She states that she was bitten in October and that they eventually turned into cellulitis and she was given a course of antibiotics.  They seemed to improve, but started getting significantly worse sometime in the last several days to week.  She is complaining of a lot of itching as well as some drainage.  She has been keeping them covered and using topical bacitracin .  She also states she has been using hydrocortisone  cream around the lesions.  As a separate issue, when advised that her blood pressure was very high, she admits that she does not take her blood pressure medication every day and does not think that she took it yesterday.    Prior to Admission medications  Medication Sig Start Date End Date Taking? Authorizing Provider  aspirin -acetaminophen -caffeine (EXCEDRIN MIGRAINE) 250-250-65 MG tablet Take 1 tablet by mouth every 6 (six) hours as needed for headache.    [provider]  bacitracin  ointment Apply 1 Application topically 2 (two) times daily. 02/20/24   Christopher Savannah, PA-C  cephALEXin  (KEFLEX ) 500 MG capsule Take 1 capsule (500 mg total) by mouth 4 (four) times daily. 04/02/24   Horton, Roxie HERO, DO  cetirizine  (ZYRTEC  ALLERGY) 10 MG tablet Take 1 tablet (10 mg total) by mouth daily. 04/02/24   Horton, Roxie HERO, DO  doxycycline  (VIBRA -TABS) 100 MG tablet Take 1 tablet (100 mg total) by mouth 2 (two) times daily. 01/06/20   Randol Simmonds, MD  escitalopram  (LEXAPRO ) 10 MG tablet Take 1 tablet (10 mg total) by mouth daily. Patient not taking: Reported on 02/25/2018 01/03/17   Kristy Sharolyn Lenis, PA-C   hydrALAZINE  (APRESOLINE ) 100 MG tablet Take 0.5 tablets (50 mg total) by mouth 2 (two) times daily. 04/02/24 05/02/24  Horton, Roxie HERO, DO  hydrochlorothiazide  (HYDRODIURIL ) 25 MG tablet Take 1 tablet (25 mg total) by mouth daily. 04/02/24   Horton, Roxie HERO, DO  lidocaine  (XYLOCAINE ) 2 % solution Use as directed 15 mLs in the mouth or throat as needed for mouth pain. 08/10/21   Prosperi, Christian H, PA-C  metoprolol  tartrate (LOPRESSOR ) 50 MG tablet Take 1 tablet (50 mg total) by mouth 2 (two) times daily. 01/05/23 02/04/23  Robinson, John K, PA-C  oxyCODONE -acetaminophen  (PERCOCET/ROXICET) 5-325 MG tablet Take 1 tablet by mouth every 6 (six) hours as needed for severe pain. 09/08/21   Lorriane Holmes, MD    Allergies: Sulfa antibiotics    Review of Systems  All other systems reviewed and are negative.   Updated Vital Signs BP (!) 207/105 (BP Location: Right Arm)   Pulse 76   Temp 97.7 F (36.5 C) (Oral)   Resp 16   LMP 11/13/2016   SpO2 99%   Physical Exam Vitals and nursing note reviewed.   54 year old female, resting comfortably and in no acute distress. Vital signs are significant for elevated blood pressure. Oxygen saturation is 99%, which is normal. Head is normocephalic and atraumatic.  Lungs are clear without rales, wheezes, or rhonchi. Heart has regular rate and rhythm without murmur. Skin: There are lesions on the  right lower leg, right thigh, and lateral right abdomen which are hyperpigmented and with heaped up margins and some clearing of the center.  Overall picture seems most consistent with tinea corporis. Neurologic: Awake and alert, moves all extremities equally.         Procedures   Medications Ordered in the ED  hydrALAZINE  (APRESOLINE ) tablet 50 mg (has no administration in time range)  hydrochlorothiazide  (HYDRODIURIL ) tablet 25 mg (has no administration in time range)  metoprolol  tartrate (LOPRESSOR ) tablet 50 mg (has no administration in time range)                                     Medical Decision Making  Lesions which started as what appear to be insect bites but now appear to be areas of tinea corporis.  Poorly controlled hypertension with medication noncompliance.  I have informed the patient that she needs to be compliant with her antihypertensive medication and should monitor her blood pressure at home.  I have ordered a dose of her antihypertensive medication here prior to discharge.  I have ordered a 2-week course of terbinafine  orally.  I have recommended that she stop using hydrocortisone .  She is requesting a refill of prescription for bacitracin  and I am ordering that.  I am ordering a prescription for cetirizine  for itching, recommend that she use diphenhydramine for breakthrough itching.  Importance of follow-up with her primary care provider was stressed.     Final diagnoses:  Tinea corporis  Poorly-controlled hypertension    ED Discharge Orders          Ordered    bacitracin  ointment  2 times daily        06/18/24 0558    cetirizine  (ZYRTEC  ALLERGY) 10 MG tablet  Daily        06/18/24 0558    terbinafine  (LAMISIL ) 250 MG tablet  Daily        06/18/24 0558               Raford Lenis, MD 06/18/24 [phone]  "
